# Patient Record
Sex: Female | Born: 1980 | Race: White | Hispanic: No | Marital: Married | Smoking: Former smoker
Health system: Southern US, Community
[De-identification: ages and names within clinical notes are randomized; demographics above are authoritative.]

## PROBLEM LIST (undated history)

## (undated) DIAGNOSIS — G039 Meningitis, unspecified: Secondary | ICD-10-CM

## (undated) DIAGNOSIS — I959 Hypotension, unspecified: Secondary | ICD-10-CM

## (undated) HISTORY — PX: KNEE SURGERY: SHX244

## (undated) HISTORY — PX: TONSILLECTOMY: SUR1361

## (undated) HISTORY — PX: APPENDECTOMY: SHX54

## (undated) HISTORY — PX: SHOULDER SURGERY: SHX246

## (undated) HISTORY — DX: Hypotension, unspecified: I95.9

---

## 2016-08-13 ENCOUNTER — Emergency Department

## 2016-08-13 ENCOUNTER — Encounter: Payer: Self-pay | Admitting: Emergency Medicine

## 2016-08-13 ENCOUNTER — Observation Stay
Admission: EM | Admit: 2016-08-13 | Discharge: 2016-08-14 | Disposition: A | Discharge status: Home / Self Care | Attending: Internal Medicine | Admitting: Internal Medicine

## 2016-08-13 DIAGNOSIS — F17210 Nicotine dependence, cigarettes, uncomplicated: Secondary | ICD-10-CM | POA: Insufficient documentation

## 2016-08-13 DIAGNOSIS — Z8661 Personal history of infections of the central nervous system: Secondary | ICD-10-CM | POA: Insufficient documentation

## 2016-08-13 DIAGNOSIS — Z885 Allergy status to narcotic agent status: Secondary | ICD-10-CM | POA: Insufficient documentation

## 2016-08-13 DIAGNOSIS — Z046 Encounter for general psychiatric examination, requested by authority: Secondary | ICD-10-CM | POA: Diagnosis present

## 2016-08-13 DIAGNOSIS — N3000 Acute cystitis without hematuria: Secondary | ICD-10-CM | POA: Insufficient documentation

## 2016-08-13 DIAGNOSIS — T39391A Poisoning by other nonsteroidal anti-inflammatory drugs [NSAID], accidental (unintentional), initial encounter: Principal | ICD-10-CM | POA: Insufficient documentation

## 2016-08-13 DIAGNOSIS — E876 Hypokalemia: Secondary | ICD-10-CM | POA: Diagnosis not present

## 2016-08-13 DIAGNOSIS — F10129 Alcohol abuse with intoxication, unspecified: Secondary | ICD-10-CM | POA: Insufficient documentation

## 2016-08-13 DIAGNOSIS — F419 Anxiety disorder, unspecified: Secondary | ICD-10-CM | POA: Insufficient documentation

## 2016-08-13 DIAGNOSIS — T510X1A Toxic effect of ethanol, accidental (unintentional), initial encounter: Secondary | ICD-10-CM | POA: Insufficient documentation

## 2016-08-13 DIAGNOSIS — R4182 Altered mental status, unspecified: Secondary | ICD-10-CM | POA: Insufficient documentation

## 2016-08-13 DIAGNOSIS — E872 Acidosis, unspecified: Secondary | ICD-10-CM

## 2016-08-13 DIAGNOSIS — Y929 Unspecified place or not applicable: Secondary | ICD-10-CM | POA: Insufficient documentation

## 2016-08-13 DIAGNOSIS — I959 Hypotension, unspecified: Secondary | ICD-10-CM

## 2016-08-13 DIAGNOSIS — T50904A Poisoning by unspecified drugs, medicaments and biological substances, undetermined, initial encounter: Secondary | ICD-10-CM

## 2016-08-13 DIAGNOSIS — F10920 Alcohol use, unspecified with intoxication, uncomplicated: Secondary | ICD-10-CM | POA: Diagnosis present

## 2016-08-13 DIAGNOSIS — G92 Toxic encephalopathy: Secondary | ICD-10-CM | POA: Diagnosis not present

## 2016-08-13 HISTORY — DX: Meningitis, unspecified: G03.9

## 2016-08-13 HISTORY — DX: Hypotension, unspecified: I95.9

## 2016-08-13 LAB — URINE DRUG SCREEN, QUALITATIVE (ARMC ONLY)
Amphetamines, Ur Screen: NOT DETECTED
BARBITURATES, UR SCREEN: NOT DETECTED
BENZODIAZEPINE, UR SCRN: NOT DETECTED
Cannabinoid 50 Ng, Ur ~~LOC~~: NOT DETECTED
Cocaine Metabolite,Ur ~~LOC~~: NOT DETECTED
MDMA (Ecstasy)Ur Screen: NOT DETECTED
Methadone Scn, Ur: NOT DETECTED
OPIATE, UR SCREEN: NOT DETECTED
PHENCYCLIDINE (PCP) UR S: NOT DETECTED
Tricyclic, Ur Screen: NOT DETECTED

## 2016-08-13 LAB — HCG, QUANTITATIVE, PREGNANCY: hCG, Beta Chain, Quant, S: <1 m[IU]/mL (ref <5)

## 2016-08-13 LAB — COMPREHENSIVE METABOLIC PANEL
ALT: 18 U/L (ref 14–54)
AST: 25 U/L (ref 15–41)
Albumin: 4.6 g/dL (ref 3.5–5.0)
Alkaline Phosphatase: 59 U/L (ref 38–126)
Anion gap: 17 — ABNORMAL HIGH (ref 5–15)
BUN: 16 mg/dL (ref 6–20)
CO2: 19 mmol/L — ABNORMAL LOW (ref 22–32)
Calcium: 9.2 mg/dL (ref 8.9–10.3)
Chloride: 104 mmol/L (ref 101–111)
Creatinine, Ser: 0.75 mg/dL (ref 0.44–1.00)
GFR calc non Af Amer: >60 mL/min (ref >60)
Glucose, Bld: 142 mg/dL — ABNORMAL HIGH (ref 65–99)
POTASSIUM: 3.2 mmol/L — AB (ref 3.5–5.1)
Sodium: 140 mmol/L (ref 135–145)
TOTAL PROTEIN: 7.7 g/dL (ref 6.5–8.1)
Total Bilirubin: 0.5 mg/dL (ref 0.3–1.2)

## 2016-08-13 LAB — CBC
HCT: 39.9 % (ref 35.0–47.0)
Hemoglobin: 14.1 g/dL (ref 12.0–16.0)
MCH: 34 pg (ref 26.0–34.0)
MCHC: 35.3 g/dL (ref 32.0–36.0)
MCV: 96.3 fL (ref 80.0–100.0)
PLATELETS: 267 10*3/uL (ref 150–440)
RBC: 4.14 MIL/uL (ref 3.80–5.20)
RDW: 12.8 % (ref 11.5–14.5)
WBC: 8.2 10*3/uL (ref 3.6–11.0)

## 2016-08-13 LAB — URINALYSIS, COMPLETE (UACMP) WITH MICROSCOPIC
Bilirubin Urine: NEGATIVE
Glucose, UA: 50 mg/dL — AB
Ketones, ur: 5 mg/dL — AB
Nitrite: NEGATIVE
PROTEIN: 100 mg/dL — AB
Specific Gravity, Urine: 1.012 (ref 1.005–1.030)
pH: 6 (ref 5.0–8.0)

## 2016-08-13 LAB — MAGNESIUM: Magnesium: 2.1 mg/dL (ref 1.7–2.4)

## 2016-08-13 LAB — ACETAMINOPHEN LEVEL

## 2016-08-13 LAB — LIPASE, BLOOD: LIPASE: 64 U/L — AB (ref 11–51)

## 2016-08-13 LAB — ETHANOL: ALCOHOL ETHYL (B): 212 mg/dL — AB (ref <5)

## 2016-08-13 LAB — INFLUENZA PANEL BY PCR (TYPE A & B)
Influenza A By PCR: NEGATIVE
Influenza B By PCR: NEGATIVE

## 2016-08-13 LAB — GLUCOSE, CAPILLARY: Glucose-Capillary: 139 mg/dL — ABNORMAL HIGH (ref 65–99)

## 2016-08-13 LAB — SALICYLATE LEVEL: Salicylate Lvl: <7 mg/dL (ref 2.8–30.0)

## 2016-08-13 LAB — TROPONIN I: Troponin I: <0.03 ng/mL (ref <0.03)

## 2016-08-13 LAB — LACTIC ACID, PLASMA
LACTIC ACID, VENOUS: 4.1 mmol/L — AB (ref 0.5–1.9)
Lactic Acid, Venous: 4.6 mmol/L (ref 0.5–1.9)

## 2016-08-13 MED ORDER — ADULT MULTIVITAMIN W/MINERALS CH: 1.0000 | ORAL_TABLET | Freq: Every day | ORAL | Status: DC

## 2016-08-13 MED ORDER — DEXTROSE 5 % IV SOLN
1.0000 g | INTRAVENOUS | Status: DC
  Administered 2016-08-13: 1 g via INTRAVENOUS
  Filled 2016-08-13 (×2): qty 10

## 2016-08-13 MED ORDER — ONDANSETRON HCL 4 MG PO TABS: 4.0000 mg | ORAL_TABLET | Freq: Four times a day (QID) | ORAL | Status: DC | PRN

## 2016-08-13 MED ORDER — LORAZEPAM 1 MG PO TABS
1.0000 mg | ORAL_TABLET | Freq: Four times a day (QID) | ORAL | Status: DC | PRN
  Administered 2016-08-14: 1 mg via ORAL
  Filled 2016-08-13: qty 1

## 2016-08-13 MED ORDER — ONDANSETRON HCL 4 MG/2ML IJ SOLN: 4.0000 mg | Freq: Four times a day (QID) | INTRAMUSCULAR | Status: DC | PRN

## 2016-08-13 MED ORDER — ENOXAPARIN SODIUM 40 MG/0.4ML ~~LOC~~ SOLN
40.0000 mg | SUBCUTANEOUS | Status: DC
  Administered 2016-08-13: 40 mg via SUBCUTANEOUS
  Filled 2016-08-13: qty 0.4

## 2016-08-13 MED ORDER — POTASSIUM CHLORIDE CRYS ER 20 MEQ PO TBCR
40.0000 meq | EXTENDED_RELEASE_TABLET | Freq: Once | ORAL | Status: AC
  Administered 2016-08-13: 40 meq via ORAL
  Filled 2016-08-13: qty 2

## 2016-08-13 MED ORDER — ACETAMINOPHEN 650 MG RE SUPP: 650.0000 mg | Freq: Four times a day (QID) | RECTAL | Status: DC | PRN

## 2016-08-13 MED ORDER — VITAMIN B-1 100 MG PO TABS: 100.0000 mg | ORAL_TABLET | Freq: Every day | ORAL | Status: DC

## 2016-08-13 MED ORDER — SODIUM CHLORIDE 0.9 % IV SOLN
INTRAVENOUS | Status: DC
  Administered 2016-08-13: 21:00:00 via INTRAVENOUS

## 2016-08-13 MED ORDER — LORAZEPAM 2 MG/ML IJ SOLN: 1.0000 mg | Freq: Four times a day (QID) | INTRAMUSCULAR | Status: DC | PRN

## 2016-08-13 MED ORDER — SODIUM CHLORIDE 0.9 % IV BOLUS (SEPSIS)
1000.0000 mL | Freq: Once | INTRAVENOUS | Status: AC
  Administered 2016-08-13: 1000 mL via INTRAVENOUS

## 2016-08-13 MED ORDER — CEFTRIAXONE SODIUM-DEXTROSE 1-3.74 GM-% IV SOLR: 1.0000 g | Freq: Every day | INTRAVENOUS | Status: DC

## 2016-08-13 MED ORDER — SODIUM CHLORIDE 0.9% FLUSH
3.0000 mL | Freq: Two times a day (BID) | INTRAVENOUS | Status: DC
  Administered 2016-08-13: 3 mL via INTRAVENOUS

## 2016-08-13 MED ORDER — NICOTINE 7 MG/24HR TD PT24
7.0000 mg | MEDICATED_PATCH | Freq: Every day | TRANSDERMAL | Status: DC
  Filled 2016-08-13: qty 1

## 2016-08-13 MED ORDER — TRAMADOL HCL 50 MG PO TABS: 50.0000 mg | ORAL_TABLET | Freq: Four times a day (QID) | ORAL | Status: DC | PRN

## 2016-08-13 MED ORDER — FOLIC ACID 1 MG PO TABS: 1.0000 mg | ORAL_TABLET | Freq: Every day | ORAL | Status: DC

## 2016-08-13 MED ORDER — THIAMINE HCL 100 MG/ML IJ SOLN: 100.0000 mg | Freq: Every day | INTRAMUSCULAR | Status: DC

## 2016-08-13 MED ORDER — ACETAMINOPHEN 325 MG PO TABS
650.0000 mg | ORAL_TABLET | Freq: Four times a day (QID) | ORAL | Status: DC | PRN
  Administered 2016-08-14: 650 mg via ORAL
  Filled 2016-08-13: qty 2

## 2016-08-13 NOTE — Consult Note (Signed)
Pharmacy Antibiotic Note  Cynthia Barnes is a 36 y.o. female admitted on 08/13/2016 with UTI.  Pharmacy has been consulted for ceftriaxone dosing.  Plan: ceftriaxone 1g q 24 hours  Height: 5' (152.4 cm) Weight: 160 lb (72.6 kg) IBW/kg (Calculated) : 45.5  Temp (24hrs), Avg:97.3 F (36.3 C), Min:97.3 F (36.3 C), Max:97.3 F (36.3 C)   Recent Labs Lab 08/13/16 1604  WBC 8.2  CREATININE 0.75  LATICACIDVEN 4.6*    Estimated Creatinine Clearance: 86.4 mL/min (by C-G formula based on SCr of 0.75 mg/dL).    Allergies  Allergen Reactions  . Codeine Other (See Comments)    Pt states her "eyes fixated upwards"    Antimicrobials this admission: Ceftriaxone 2/9  >>    Dose adjustments this admission:   Microbiology results: 2/9 BCx:  2/9 UCx:     Thank you for allowing pharmacy to be a part of this patient's care.  Olene FlossMelissa D Clay Solum, Pharm.D, BCPS Clinical Pharmacist  08/13/2016 7:32 PM

## 2016-08-13 NOTE — H&P (Signed)
[facility] [place] [facility]   PATIENT NAME: Cynthia Barnes    MR#:  [id number]  DATE OF BIRTH:  26-Aug-1980  DATE OF ADMISSION:  08/13/2016  PRIMARY CARE PHYSICIAN: Pcp Not In System   REQUESTING/REFERRING PHYSICIAN: Dr. Sharyn Creamer  CHIEF COMPLAINT:   Chief Complaint  Patient presents with  . Drug Overdose    HISTORY OF PRESENT ILLNESS:  [name]  is a 36 y.o. female with No significant past medical history presents to the hospital secondary to unresponsive episode. Patient is awake alert oriented 3 and is able to provide most of the history. She says that she consumes alcohol occasionally, however according to husband she drinks alcohol without anybody noticing it. Her serum alcohol level was greater than 200 today. Also she has had rotator cuff surgery on her right shoulder and degenerative disc disease in her neck, has back pains occasionally. She usually takes ibuprofen for that. Her husband had a prescription of meloxicam for 30 pills given in December 2017, unknown number of pills left over in the bottle. She was noted to have taken few of those pills. Patient is awake and states she took only 4 or 5 pills for her pain. Denies any suicidal ideation. Her blood pressure was noted to be low with systolic in the 60s on arrival. Improving slowly with IV fluids. She denies any chest pain, headaches or dizziness. No fevers, chills, nausea or vomiting noted. No arrhythmias on telemetry.  PAST MEDICAL HISTORY:   Past Medical History:  Diagnosis Date  . Meningitis     PAST SURGICAL HISTORY:   Past Surgical History:  Procedure Laterality Date  . APPENDECTOMY    . CESAREAN SECTION    . KNEE SURGERY Left   . SHOULDER SURGERY Right   . TONSILLECTOMY      SOCIAL HISTORY:   Social History  Substance Use Topics  . Smoking status: Current Every Day Smoker    Packs/day: 0.50    Types: Cigarettes  . Smokeless tobacco: Never Used  .  Alcohol use Yes     Comment: Pt states drinking "ocassionally"    FAMILY HISTORY:  No family history on file.  DRUG ALLERGIES:   Allergies  Allergen Reactions  . Codeine Other (See Comments)    Pt states her "eyes fixated upwards"    REVIEW OF SYSTEMS:   Review of Systems  Constitutional: Positive for malaise/fatigue. Negative for chills, fever and weight loss.  HENT: Negative for ear discharge, ear pain, hearing loss and nosebleeds.   Eyes: Negative for blurred vision, double vision and photophobia.  Respiratory: Negative for cough, hemoptysis, shortness of breath and wheezing.   Cardiovascular: Negative for chest pain, palpitations, orthopnea and leg swelling.  Gastrointestinal: Negative for abdominal pain, constipation, diarrhea, melena, nausea and vomiting.  Genitourinary: Negative for dysuria and urgency.  Musculoskeletal: Positive for back pain and myalgias. Negative for neck pain.  Skin: Negative for rash.  Neurological: Negative for dizziness, sensory change, speech change, focal weakness and headaches.  Endo/Heme/Allergies: Does not bruise/bleed easily.  Psychiatric/Behavioral: Negative for depression. The patient is nervous/anxious.     MEDICATIONS AT HOME:   Prior to Admission medications   Not on File      VITAL SIGNS:  Blood pressure 110/60, pulse 80, temperature 97.3 F (36.3 C), temperature source Oral, resp. rate (!) 24, height 5' (1.524 m), weight 72.6 kg (160 lb), SpO2 98 %.  PHYSICAL EXAMINATION:   Physical Exam  GENERAL:  36 y.o.-year-old  patient lying in the bed with no acute distress.  EYES: Pupils equal, round, reactive to light and accommodation. No scleral icterus. Extraocular muscles intact.  HEENT: Head atraumatic, normocephalic. Oropharynx and nasopharynx clear. Small contusion on forehead NECK:  Supple, no jugular venous distention. No thyroid enlargement, no tenderness.  LUNGS: Normal breath sounds bilaterally, no wheezing,  rales,rhonchi or crepitation. No use of accessory muscles of respiration.  CARDIOVASCULAR: S1, S2 normal. No murmurs, rubs, or gallops.  ABDOMEN: Soft, nontender, nondistended. Bowel sounds present. No organomegaly or mass.  EXTREMITIES: No pedal edema, cyanosis, or clubbing. Tender right rotator cuff NEUROLOGIC: Cranial nerves II through XII are intact. Muscle strength 5/5 in all extremities. Sensation intact. Gait not checked.  PSYCHIATRIC: The patient is alert and oriented x 3.  SKIN: No obvious rash, lesion, or ulcer.   LABORATORY PANEL:   CBC  Recent Labs Lab 08/13/16 1604  WBC 8.2  HGB 14.1  HCT 39.9  PLT 267   ------------------------------------------------------------------------------------------------------------------  Chemistries   Recent Labs Lab 08/13/16 1604  NA 140  K 3.2*  CL 104  CO2 19*  GLUCOSE 142*  BUN 16  CREATININE 0.75  CALCIUM 9.2  AST 25  ALT 18  ALKPHOS 59  BILITOT 0.5   ------------------------------------------------------------------------------------------------------------------  Cardiac Enzymes  Recent Labs Lab 08/13/16 1604  TROPONINI <0.03   ------------------------------------------------------------------------------------------------------------------  RADIOLOGY:  Ct Head Wo Contrast  Result Date: 08/13/2016 CLINICAL DATA:  Status post fall, right forehead swelling EXAM: CT HEAD WITHOUT CONTRAST TECHNIQUE: Contiguous axial images were obtained from the base of the skull through the vertex without intravenous contrast. COMPARISON:  None. FINDINGS: Brain: No evidence of acute infarction, hemorrhage, hydrocephalus, extra-axial collection or mass lesion/mass effect. Vascular: No hyperdense vessel or unexpected calcification. Skull: Normal. Negative for fracture or focal lesion. Sinuses/Orbits: No acute finding. Other: None. IMPRESSION: No acute intracranial pathology. Electronically Signed   By: Elige Ko   On: 08/13/2016  17:41   Dg Chest Port 1 View  Result Date: 08/13/2016 CLINICAL DATA:  Cough.  Hypotension. EXAM: PORTABLE CHEST 1 VIEW COMPARISON:  None. FINDINGS: Lungs are clear. Heart size and pulmonary vascularity are normal. No adenopathy. No bone lesions. IMPRESSION: No edema or consolidation. Electronically Signed   By: Bretta Bang III M.D.   On: 08/13/2016 16:20    EKG:   Orders placed or performed during the hospital encounter of 08/13/16  . ED EKG  . ED EKG  . EKG 12-Lead  . EKG 12-Lead    IMPRESSION AND PLAN:   Kevyn Wengert  is a 36 y.o. female with No significant past medical history presents to the hospital secondary to unresponsive episode.  #1 altered mental status-toxic metabolic encephalopathy. -Due to consumption of alcohol and also meloxicam at the same time. -Denies any suicidal ideation. No prior suicidal attempts, denies any depression. Does have anxiety issues. -: Currently under involuntary commitment,  awaiting psych consult.  #2 hypotension-likely hypovolemic. Continue IV fluids as blood pressure is responding to fluids. -Not symptomatic. Monitor on the floor.  #3 hypokalemia-being replaced. Check magnesium level  #4 tobacco use disorder-counseled. Started on nicotine patch  #5 alcohol abuse-started on CIWA protocol.  #6 DVT prophylaxis-on Lovenox  #7 lactic acidosis-likely triggered by hypotension. No source of infection identified. Chest x-rays clear. No fevers noted. Pending urine analysis.    All the records are reviewed and case discussed with ED provider. Management plans discussed with the patient, family and they are in agreement.  CODE STATUS: Full Code  TOTAL  TIME TAKING CARE OF THIS PATIENT: 50 minutes.    Virga Haltiwanger M.D on 08/13/2016 at 6:32 PM  Between 7am to 6pm - Pager - [phone]  After 6pm go to [email or web address] - password [email or web address]  [facility]  Office  [phone]  CC: Primary care physician;  Pcp Not In System

## 2016-08-13 NOTE — ED Triage Notes (Signed)
Pt brought in by EMS c/o ingestion of unknown quantity of Meloxicam 15mg  tabs belonging to her husband. Per EMS initial bp was 48/24; after 400 ml of 500 ml bolus bp increased to 91/54, heart rate was in the 100s, and 94% on room air.  Pt present to ED alert and oriented x4; able to speak in complete sentences is able to speak coherently.

## 2016-08-13 NOTE — ED Provider Notes (Signed)
[facility] Emergency Department Provider Note   ____________________________________________   First MD Initiated Contact with Patient 08/13/16 1557     (approximate)  I have reviewed the triage vital signs and the nursing notes.   HISTORY  Chief Complaint  HPI Cynthia Barnes is a 36 y.o. female here for evaluation of a possible overdose  EMS reports that the patient and husband came home, found her tired, drowsy, and noted that a bottle of his meloxicam was empty. The patient was initially hypotensive, cyanotic around the lips with somnolence when EMS arrived. They report after receiving fluids for blood pressure improved from 50/20, into the 80s  The patient has become more responsive, is now conversant  The patient herself reports that today she took 2 of her husbands meloxicam because she has chronic back pain, she also took a "shot of makers Praise Dolecki" which she drinks almost daily, and then was still having discomfort so she took an additional meloxicam  The patient denies suicidal ideation, reports she would never do anything to harm herself because of her children  She denies taking any other ingestants. She has had a cough and runny nose for the last week, reports mild symptoms and thinks she could've had "the flu" but overall is getting better. She has not been eating and drinking as much as she normally would.  EMS also reports the patient was walking and became weak and fell striking the right front of her forehead with a small hematoma in they're present.  Patient denies any weakness in an arm or leg. No headache. No nausea or vomiting.  Past Medical History:  Diagnosis Date  . Meningitis     Patient Active Problem List   Diagnosis Date Noted  . Hypotension 08/13/2016    Past Surgical History:  Procedure Laterality Date  . APPENDECTOMY    . CESAREAN SECTION    . KNEE SURGERY Left   . SHOULDER SURGERY Right   . TONSILLECTOMY        Prior to Admission medications   Medication Sig Start Date End Date Taking? Authorizing Provider  ibuprofen (ADVIL,MOTRIN) 200 MG tablet Take 200 mg by mouth every 6 (six) hours as needed.   Yes Historical Provider, MD  Multiple Vitamin (MULTIVITAMIN) tablet Take 1 tablet by mouth daily.   Yes Historical Provider, MD    Allergies Codeine  No family history on file.  Social History Social History  Substance Use Topics  . Smoking status: Current Every Day Smoker    Packs/day: 0.50    Types: Cigarettes  . Smokeless tobacco: Never Used  . Alcohol use Yes     Comment: Pt states drinking "ocassionally"   Denies smoking. Does report she drinks about one shot of alcohol daily. Denies any other drug use  Review of Systems Constitutional: Felt like she was having fevers a few days ago with "the flu" which is not getting better Eyes: No visual changes. ENT: No sore throat. Cardiovascular: Denies chest pain. Respiratory: Denies shortness of breath. A dry nonproductive cough for the last week. Gastrointestinal: No abdominal pain.  No nausea, no vomiting.  No diarrhea.  No constipation. Genitourinary: Negative for dysuria. Musculoskeletal: Negative for back pain. Skin: Negative for rash. Neurological: Negative for headaches, focal weakness or numbness.  10-point ROS otherwise negative.  ____________________________________________   PHYSICAL EXAM:  VITAL SIGNS: ED Triage Vitals  Enc Vitals Group     BP      Pulse  Resp      Temp      Temp src      SpO2      Weight      Height      Head Circumference      Peak Flow      Pain Score      Pain Loc      Pain Edu?      Excl. in GC?     Constitutional: Alert and oriented. Mildly ill-appearing, slightly diaphoretic and slightly cool skin. She is well oriented, with no evidence of lethargy or somnolence at this time. She is tearful. Eyes: Conjunctivae are normal. PERRL. EOMI. Head: Atraumatic. Nose: No  congestion/rhinnorhea. Mouth/Throat: Mucous membranes are notably dry.  Oropharynx non-erythematous. Neck: No stridor.   Cardiovascular: Normal rate, regular rhythm. Grossly normal heart sounds.  Good peripheral circulation. Respiratory: Normal respiratory effort.  No retractions. Lungs CTAB. Gastrointestinal: Soft and nontender. No distention. No abdominal bruits. No CVA tenderness. A small, what appears to be old ecchymosis is noted about the size of a half-dollar over the left lower abdomen. Patient denies any tenderness or pain in the area. Patient reports she's not sure where that came from. Musculoskeletal: No lower extremity tenderness nor edema.  No joint effusions. Neurologic:  Normal speech and language. No gross focal neurologic deficits are appreciated. Moves all extremities equally. No facial droop. Normal cranial nerve exam. 5 out of 5 strength in all extremities. Skin:  Skin is warm, dry and intact. No rash noted. Psychiatric: Mood and affect are tearful, slightly flat. Speech and behavior are normal. The patient denies that this was any sort of suicide attempt. She reports she took meloxicam for treatment of her chronic back pain, as well as use alcohol today, but denies that she would ever harm herself. ____________________________________________   LABS (all labs ordered are listed, but only abnormal results are displayed)  Labs Reviewed  LACTIC ACID, PLASMA - Abnormal; Notable for the following:       Result Value   Lactic Acid, Venous 4.6 (*)    All other components within normal limits  COMPREHENSIVE METABOLIC PANEL - Abnormal; Notable for the following:    Potassium 3.2 (*)    CO2 19 (*)    Glucose, Bld 142 (*)    Anion gap 17 (*)    All other components within normal limits  LIPASE, BLOOD - Abnormal; Notable for the following:    Lipase 64 (*)    All other components within normal limits  URINALYSIS, COMPLETE (UACMP) WITH MICROSCOPIC - Abnormal; Notable for the  following:    Color, Urine YELLOW (*)    APPearance HAZY (*)    Glucose, UA 50 (*)    Hgb urine dipstick SMALL (*)    Ketones, ur 5 (*)    Protein, ur 100 (*)    Leukocytes, UA TRACE (*)    Bacteria, UA FEW (*)    Squamous Epithelial / LPF 0-5 (*)    All other components within normal limits  ETHANOL - Abnormal; Notable for the following:    Alcohol, Ethyl (B) 212 (*)    All other components within normal limits  ACETAMINOPHEN LEVEL - Abnormal; Notable for the following:    Acetaminophen (Tylenol), Serum <10 (*)    All other components within normal limits  GLUCOSE, CAPILLARY - Abnormal; Notable for the following:    Glucose-Capillary 139 (*)    All other components within normal limits  CULTURE, BLOOD (ROUTINE X 2)  CULTURE, BLOOD (ROUTINE X 2)  URINE CULTURE  CBC  TROPONIN I  HCG, QUANTITATIVE, PREGNANCY  INFLUENZA PANEL BY PCR (TYPE A & B)  SALICYLATE LEVEL  URINE DRUG SCREEN, QUALITATIVE ([facility] ONLY)  MAGNESIUM  LACTIC ACID, PLASMA  LACTIC ACID, PLASMA   ____________________________________________  EKG  ED ECG REPORT I, Toniya Rozar, the attending physician, personally viewed and interpreted this ECG.  Date: 08/13/2016 EKG Time: 1557 Rate: 80 Rhythm: normal sinus rhythm QRS Axis: normal Intervals: normal ST/T Wave abnormalities: normal Conduction Disturbances: A slight Q-wave is noted in V2, Narrative Interpretation: unremarkable except for a Q-wave in V2, no evidence of acute ischemia with normal T waves noted  ____________________________________________  RADIOLOGY  Ct Head Wo Contrast  Result Date: 08/13/2016 CLINICAL DATA:  Status post fall, right forehead swelling EXAM: CT HEAD WITHOUT CONTRAST TECHNIQUE: Contiguous axial images were obtained from the base of the skull through the vertex without intravenous contrast. COMPARISON:  None. FINDINGS: Brain: No evidence of acute infarction, hemorrhage, hydrocephalus, extra-axial collection or mass lesion/mass  effect. Vascular: No hyperdense vessel or unexpected calcification. Skull: Normal. Negative for fracture or focal lesion. Sinuses/Orbits: No acute finding. Other: None. IMPRESSION: No acute intracranial pathology. Electronically Signed   By: Elige KoHetal  Patel   On: 08/13/2016 17:41   Dg Chest Port 1 View  Result Date: 08/13/2016 CLINICAL DATA:  Cough.  Hypotension. EXAM: PORTABLE CHEST 1 VIEW COMPARISON:  None. FINDINGS: Lungs are clear. Heart size and pulmonary vascularity are normal. No adenopathy. No bone lesions. IMPRESSION: No edema or consolidation. Electronically Signed   By: Bretta BangWilliam  Woodruff III M.D.   On: 08/13/2016 16:20    ____________________________________________   PROCEDURES  Procedure(s) performed: None  Procedures  Critical Care performed: Yes, see critical care note(s)  ____________________________________________   INITIAL IMPRESSION / ASSESSMENT AND PLAN / ED COURSE  Pertinent labs & imaging results that were available during my care of the patient were reviewed by me and considered in my medical decision making (see chart for details).  Hypotension. Broad differential and workup. Suspect likely dehydration given fluid responsiveness. No cardiac abnormalities, no pulmonary abnormalities. Recent cold-like symptoms, but also associated with what appears to be alcohol intoxication with unclear intent of overuse of meloxicam, versus possible suicidal intent though favor a prior primarily.  Clinical Course as of Aug 14 1927  Fri Aug 13, 2016  1754 Critical lactate. 2 L of normal saline artery completed, third liter ordered. Repeat lactate ordered. At this point I am unclear if the elevated lactate is truly due to an infectious etiology, potentially related to this acute hypotensive episode area  Afebrile, no elevated white count. No infectious symptoms other than recent cough cold congestion which she reports are improving.  CRITICAL CARE Performed by: Sharyn CreamerQUALE,  Macsen Nuttall   Total critical care time: 40 minutes  Critical care time was exclusive of separately billable procedures and treating other patients.  Critical care was necessary to treat or prevent imminent or life-threatening deterioration.  Critical care was time spent personally by me on the following activities: development of treatment plan with patient and/or surrogate as well as nursing, discussions with consultants, evaluation of patient's response to treatment, examination of patient, obtaining history from patient or surrogate, ordering and performing treatments and interventions, ordering and review of laboratory studies, ordering and review of radiographic studies, pulse oximetry and re-evaluation of patient's condition.   [MQ]  1928 Hemodynamics improving. Repeat lactate pending soon. Patient remains awake and alert without distress  [MQ]  Clinical Course User Index [MQ] Sharyn Creamer, MD     ____________________________________________   FINAL CLINICAL IMPRESSION(S) / ED DIAGNOSES  Final diagnoses:  Lactic acid acidosis  Hypotension, unspecified hypotension type  Alcoholic intoxication without complication (HCC)  Drug overdose, undetermined intent, initial encounter  Involuntary commitment      NEW MEDICATIONS STARTED DURING THIS VISIT:  New Prescriptions   No medications on file     Note:  This document was prepared using Dragon voice recognition software and may include unintentional dictation errors.     Sharyn Creamer, MD 08/13/16 [id number]

## 2016-08-14 ENCOUNTER — Encounter (HOSPITAL_COMMUNITY): Payer: Self-pay | Admitting: Psychiatry

## 2016-08-14 LAB — BASIC METABOLIC PANEL
Anion gap: 7 (ref 5–15)
BUN: 15 mg/dL (ref 6–20)
CALCIUM: 8 mg/dL — AB (ref 8.9–10.3)
CO2: 19 mmol/L — ABNORMAL LOW (ref 22–32)
CREATININE: 0.73 mg/dL (ref 0.44–1.00)
Chloride: 112 mmol/L — ABNORMAL HIGH (ref 101–111)
GFR calc Af Amer: >60 mL/min (ref >60)
Glucose, Bld: 85 mg/dL (ref 65–99)
POTASSIUM: 3.9 mmol/L (ref 3.5–5.1)
SODIUM: 138 mmol/L (ref 135–145)

## 2016-08-14 LAB — LACTIC ACID, PLASMA
LACTIC ACID, VENOUS: 2.5 mmol/L — AB (ref 0.5–1.9)
Lactic Acid, Venous: 2.5 mmol/L (ref 0.5–1.9)
Lactic Acid, Venous: 2.4 mmol/L (ref 0.5–1.9)

## 2016-08-14 LAB — BLOOD CULTURE ID PANEL (REFLEXED)
Acinetobacter baumannii: NOT DETECTED
CANDIDA TROPICALIS: NOT DETECTED
Candida albicans: NOT DETECTED
Candida glabrata: NOT DETECTED
Candida krusei: NOT DETECTED
Candida parapsilosis: NOT DETECTED
ENTEROCOCCUS SPECIES: NOT DETECTED
Enterobacter cloacae complex: NOT DETECTED
Enterobacteriaceae species: NOT DETECTED
Escherichia coli: NOT DETECTED
Haemophilus influenzae: NOT DETECTED
KLEBSIELLA PNEUMONIAE: NOT DETECTED
Klebsiella oxytoca: NOT DETECTED
LISTERIA MONOCYTOGENES: NOT DETECTED
Methicillin resistance: DETECTED — AB
NEISSERIA MENINGITIDIS: NOT DETECTED
Proteus species: NOT DETECTED
Pseudomonas aeruginosa: NOT DETECTED
SERRATIA MARCESCENS: NOT DETECTED
STAPHYLOCOCCUS AUREUS BCID: NOT DETECTED
STAPHYLOCOCCUS SPECIES: DETECTED — AB
STREPTOCOCCUS AGALACTIAE: NOT DETECTED
STREPTOCOCCUS SPECIES: NOT DETECTED
Streptococcus pneumoniae: NOT DETECTED
Streptococcus pyogenes: NOT DETECTED

## 2016-08-14 LAB — CBC
HCT: 32.1 % — ABNORMAL LOW (ref 35.0–47.0)
Hemoglobin: 11.2 g/dL — ABNORMAL LOW (ref 12.0–16.0)
MCH: 33.7 pg (ref 26.0–34.0)
MCHC: 34.8 g/dL (ref 32.0–36.0)
MCV: 97 fL (ref 80.0–100.0)
PLATELETS: 219 10*3/uL (ref 150–440)
RBC: 3.31 MIL/uL — ABNORMAL LOW (ref 3.80–5.20)
RDW: 12.5 % (ref 11.5–14.5)
WBC: 10.7 10*3/uL (ref 3.6–11.0)

## 2016-08-14 LAB — HEMOGLOBIN: HEMOGLOBIN: 11.7 g/dL — AB (ref 12.0–16.0)

## 2016-08-14 MED ORDER — CEPHALEXIN 250 MG PO CAPS: 250.0000 mg | ORAL_CAPSULE | Freq: Three times a day (TID) | ORAL | 0 refills | Status: DC

## 2016-08-14 NOTE — Progress Notes (Addendum)
Dr. Daleen Boavi gave verbal order to D/C order for IVC. Spoke with Dr. Nemiah CommanderKalisetti. Pt ok to discharge. Will f/u as outpatient.

## 2016-08-14 NOTE — Progress Notes (Signed)
Patient discharging home. Instructions given to patient, verbalized understanding. Husband will be providing transportation home. Waiting on husband. IV removed. Continue to monitor.

## 2016-08-14 NOTE — Discharge Summary (Signed)
[facility] [place] [facility]   PATIENT NAME: Cynthia Barnes    MR#:  [id number]  DATE OF BIRTH:  [birth year]  DATE OF ADMISSION:  08/13/2016   ADMITTING PHYSICIAN: Enid Baas, MD  DATE OF DISCHARGE: 08/14/16  PRIMARY CARE PHYSICIAN: Pcp Not In System   ADMISSION DIAGNOSIS:   Lactic acid acidosis [E87.2] Involuntary commitment [Z04.6] Alcoholic intoxication without complication (HCC) [F10.920] Drug overdose, undetermined intent, initial encounter [T50.904A] Hypotension, unspecified hypotension type [I95.9]  DISCHARGE DIAGNOSIS:   Active Problems:   Hypotension   SECONDARY DIAGNOSIS:   Past Medical History:  Diagnosis Date  . Meningitis     HOSPITAL COURSE:   Cynthia Barnes  is a 36 y.o. female with No significant past medical history presents to the hospital secondary to unresponsive episode.  #1 altered mental status-toxic metabolic encephalopathy. -Due to consumption of alcohol and also meloxicam at the same time. -Denies any suicidal ideation. No prior suicidal attempts, denies any depression. Does have anxiety issues. -awaiting psych consult. - will need outpatient psychiatrist follow up for anxiety issues ad may be post partum depression - discussed about concomitant use of alcohol with pain meds should be avoided  #2 hypotension-likely hypovolemic. Improved with IV fluids  #3 hypokalemia-replaced.   #4 tobacco use disorder-counseled. on nicotine patch in the hospital  #5 alcohol abuse-on CIWA protocol. ativan PRN received more for anxiety  #6 lactic acidosis-likely triggered by hypotension. Chest x-rays clear. No fevers noted. Hb stable Treating UTI. Stable lactic acid, though slightly elevated  #7 Acute cystitis- urine cultures sent, received rocephin in the hospital. Discharge on keflex   DISCHARGE CONDITIONS:   Guarded  CONSULTS OBTAINED:   Treatment Team:  Patrick North, MD  DRUG  ALLERGIES:   Allergies  Allergen Reactions  . Codeine Other (See Comments)    Pt states her "eyes fixated upwards"   DISCHARGE MEDICATIONS:   Allergies as of 08/14/2016      Reactions   Codeine Other (See Comments)   Pt states her "eyes fixated upwards"      Medication List    TAKE these medications   cephALEXin 250 MG capsule Commonly known as:  KEFLEX Take 1 capsule (250 mg total) by mouth 3 (three) times daily. X 5 more days   ibuprofen 200 MG tablet Commonly known as:  ADVIL,MOTRIN Take 200 mg by mouth every 6 (six) hours as needed.   multivitamin tablet Take 1 tablet by mouth daily.        DISCHARGE INSTRUCTIONS:   1. Psych f/u in 1 week  DIET:   Regular diet  ACTIVITY:   Activity as tolerated  OXYGEN:   Home Oxygen: No.  Oxygen Delivery: room air  DISCHARGE LOCATION:   home   If you experience worsening of your admission symptoms, develop shortness of breath, life threatening emergency, suicidal or homicidal thoughts you must seek medical attention immediately by calling 911 or calling your MD immediately  if symptoms less severe.  You Must read complete instructions/literature along with all the possible adverse reactions/side effects for all the Medicines you take and that have been prescribed to you. Take any new Medicines after you have completely understood and accpet all the possible adverse reactions/side effects.   Please note  You were cared for by a hospitalist during your hospital stay. If you have any questions about your discharge medications or the care you received while you were in the hospital after you are discharged, you can call the unit  and asked to speak with the hospitalist on call if the hospitalist that took care of you is not available. Once you are discharged, your primary care physician will handle any further medical issues. Please note that NO REFILLS for any discharge medications will be authorized once you are  discharged, as it is imperative that you return to your primary care physician (or establish a relationship with a primary care physician if you do not have one) for your aftercare needs so that they can reassess your need for medications and monitor your lab values.    On the day of Discharge:  VITAL SIGNS:   Blood pressure (!) 162/89, pulse 99, temperature 97.9 F (36.6 C), resp. rate 18, height 5' (1.524 m), weight 72.6 kg (160 lb), SpO2 99 %.  PHYSICAL EXAMINATION:    GENERAL:  36 y.o.-year-old patient lying in the bed with no acute distress.  EYES: Pupils equal, round, reactive to light and accommodation. No scleral icterus. Extraocular muscles intact.  HEENT: Head atraumatic, normocephalic. Oropharynx and nasopharynx clear. Small contusion on forehead NECK:  Supple, no jugular venous distention. No thyroid enlargement, no tenderness.  LUNGS: Normal breath sounds bilaterally, no wheezing, rales,rhonchi or crepitation. No use of accessory muscles of respiration.  CARDIOVASCULAR: S1, S2 normal. No murmurs, rubs, or gallops.  ABDOMEN: Soft, nontender, nondistended. Bowel sounds present. No organomegaly or mass.  EXTREMITIES: No pedal edema, cyanosis, or clubbing. Tender right rotator cuff NEUROLOGIC: Cranial nerves II through XII are intact. Muscle strength 5/5 in all extremities. Sensation intact. Gait not checked.  PSYCHIATRIC: The patient is alert and oriented x 3.  SKIN: No obvious rash, lesion, or ulcer.   DATA REVIEW:   CBC  Recent Labs Lab 08/14/16 0304 08/14/16 0913  WBC 10.7  --   HGB 11.2* 11.7*  HCT 32.1*  --   PLT 219  --     Chemistries   Recent Labs Lab 08/13/16 1604 08/14/16 0304  NA 140 138  K 3.2* 3.9  CL 104 112*  CO2 19* 19*  GLUCOSE 142* 85  BUN 16 15  CREATININE 0.75 0.73  CALCIUM 9.2 8.0*  MG 2.1  --   AST 25  --   ALT 18  --   ALKPHOS 59  --   BILITOT 0.5  --      Microbiology Results  Results for orders placed or performed during  the hospital encounter of 08/13/16  Culture, blood (Routine X 2) w Reflex to ID Panel     Status: None (Preliminary result)   Collection Time: 08/13/16  4:04 PM  Result Value Ref Range Status   Specimen Description BLOOD LEFT WRIST  Final   Special Requests BOTTLES DRAWN AEROBIC AND ANAEROBIC AER9ML ANA7ML  Final   Culture NO GROWTH < 24 HOURS  Final   Report Status PENDING  Incomplete  Culture, blood (Routine X 2) w Reflex to ID Panel     Status: None (Preliminary result)   Collection Time: 08/13/16  4:04 PM  Result Value Ref Range Status   Specimen Description BLOOD RIGHT ASSIST CONTROL  Final   Special Requests BOTTLES DRAWN AEROBIC AND ANAEROBIC ANA6ML AER5ML  Final   Culture NO GROWTH < 24 HOURS  Final   Report Status PENDING  Incomplete    RADIOLOGY:  Ct Head Wo Contrast  Result Date: 08/13/2016 CLINICAL DATA:  Status post fall, right forehead swelling EXAM: CT HEAD WITHOUT CONTRAST TECHNIQUE: Contiguous axial images were obtained from the base of the skull  through the vertex without intravenous contrast. COMPARISON:  None. FINDINGS: Brain: No evidence of acute infarction, hemorrhage, hydrocephalus, extra-axial collection or mass lesion/mass effect. Vascular: No hyperdense vessel or unexpected calcification. Skull: Normal. Negative for fracture or focal lesion. Sinuses/Orbits: No acute finding. Other: None. IMPRESSION: No acute intracranial pathology. Electronically Signed   By: Elige Ko   On: 08/13/2016 17:41   Dg Chest Port 1 View  Result Date: 08/13/2016 CLINICAL DATA:  Cough.  Hypotension. EXAM: PORTABLE CHEST 1 VIEW COMPARISON:  None. FINDINGS: Lungs are clear. Heart size and pulmonary vascularity are normal. No adenopathy. No bone lesions. IMPRESSION: No edema or consolidation. Electronically Signed   By: Bretta Bang III M.D.   On: 08/13/2016 16:20     Management plans discussed with the patient, family and they are in agreement.  CODE STATUS:     Code Status  Orders        Start     Ordered   08/13/16 2015  Full code  Continuous     08/13/16 2014    Code Status History    Date Active Date Inactive Code Status Order ID Comments User Context   This patient has a current code status but no historical code status.      TOTAL TIME TAKING CARE OF THIS PATIENT: 37 minutes.    Cynthia Barnes M.D on 08/14/2016 at 12:09 PM  Between 7am to 6pm - Pager - [phone]  After 6pm go to [email or web address] - Social research officer, government  [facility]  Office  [phone]  CC: Primary care physician; Pcp Not In System   Note: This dictation was prepared with Dragon dictation along with smaller phrase technology. Any transcriptional errors that result from this process are unintentional.

## 2016-08-14 NOTE — BH Assessment (Signed)
Writer informed psychiatrist (Dr. Ravi) of consult.  

## 2016-08-15 LAB — URINE CULTURE

## 2016-08-15 NOTE — Progress Notes (Signed)
PHARMACY - PHYSICIAN COMMUNICATION CRITICAL VALUE ALERT - BLOOD CULTURE IDENTIFICATION (BCID)  Results for orders placed or performed during the hospital encounter of 08/13/16  Blood Culture ID Panel (Reflexed) (Collected: 08/13/2016  4:04 PM)  Result Value Ref Range   Enterococcus species NOT DETECTED NOT DETECTED   Listeria monocytogenes NOT DETECTED NOT DETECTED   Staphylococcus species DETECTED (A) NOT DETECTED   Staphylococcus aureus NOT DETECTED NOT DETECTED   Methicillin resistance DETECTED (A) NOT DETECTED   Streptococcus species NOT DETECTED NOT DETECTED   Streptococcus agalactiae NOT DETECTED NOT DETECTED   Streptococcus pneumoniae NOT DETECTED NOT DETECTED   Streptococcus pyogenes NOT DETECTED NOT DETECTED   Acinetobacter baumannii NOT DETECTED NOT DETECTED   Enterobacteriaceae species NOT DETECTED NOT DETECTED   Enterobacter cloacae complex NOT DETECTED NOT DETECTED   Escherichia coli NOT DETECTED NOT DETECTED   Klebsiella oxytoca NOT DETECTED NOT DETECTED   Klebsiella pneumoniae NOT DETECTED NOT DETECTED   Proteus species NOT DETECTED NOT DETECTED   Serratia marcescens NOT DETECTED NOT DETECTED   Haemophilus influenzae NOT DETECTED NOT DETECTED   Neisseria meningitidis NOT DETECTED NOT DETECTED   Pseudomonas aeruginosa NOT DETECTED NOT DETECTED   Candida albicans NOT DETECTED NOT DETECTED   Candida glabrata NOT DETECTED NOT DETECTED   Candida krusei NOT DETECTED NOT DETECTED   Candida parapsilosis NOT DETECTED NOT DETECTED   Candida tropicalis NOT DETECTED NOT DETECTED    Name of physician (or Provider) Contacted: Willis/ Bonne DoloresKalisetti Jason spoke with MD Anne HahnWillis who [facility].  I spoke with MD Nemiah CommanderKalisetti this morning and she is ok with not treating for now. MD would like Pharmacy to follow micro ID and C and S. Call MD with final results please.    Changes to prescribed antibiotics  required: See above.   Magally Vahle D 08/15/2016  8:12 AM

## 2016-08-17 LAB — CULTURE, BLOOD (ROUTINE X 2)

## 2016-08-17 NOTE — Progress Notes (Signed)
Patient ID: Cynthia Barnes, female   DOB: 02/22/1981, 36 y.o.   MRN: [id number]  Patient is a 36 year old Caucasian female who was admitted with a probable overdose on her meloxicam and alcohol intoxication.  Patient was involuntarily committed by the ER physician and a consult was called  for psychiatry to assess patient on the unit. Patient reported that she has some severe neck and arm pain issues and that she had been drinking 4-5 cocktails and did not realize that she had taken more than she shared off her pain medication. Reports that she is a 36-year-old child and an 3914-month-old and had been feeling overwhelmed for a long time. She denied that this was a suicide attempt and stated that she would never do this. Patient has never seen a psychiatrist previously and has never had any suicide attempts. She has never been hospitalized for psychiatric reasons. Patient did report that she had mentioned being overwhelmed several times to her primary care physicians but it was not given the due to tension. She does endorse being highly anxious and being overwhelmed with her chores. Reports that her husband has a very busy job and since he recently moved here she has been feeling lonely and does not have much support. She denied any psychotic symptoms. She denied abuse of any other drugs. She did agree that her alcohol consumption was more than she should be drinking. Denies any suicidal thoughts. She appeared to have fair insight into her condition.  Impression and follow-up  1. Alcohol abuse Patient to follow up with outpatient psychiatry and counseling to address this issue.  2. Anxiety Name as above  Assessment and plan discussed with the patient's provider Dr. Nemiah CommanderKalisetti , who agreed with this provider and the patient will be given resources for follow-up with psychiatry. Patient at this time does not meet criteria for inpatient hospitalization.

## 2016-08-18 LAB — CULTURE, BLOOD (ROUTINE X 2): Culture: NO GROWTH

## 2016-09-02 DIAGNOSIS — R030 Elevated blood-pressure reading, without diagnosis of hypertension: Secondary | ICD-10-CM | POA: Insufficient documentation

## 2016-09-02 DIAGNOSIS — F419 Anxiety disorder, unspecified: Secondary | ICD-10-CM | POA: Insufficient documentation

## 2017-03-29 ENCOUNTER — Encounter: Payer: Self-pay | Admitting: Emergency Medicine

## 2017-03-29 ENCOUNTER — Emergency Department
Admission: EM | Admit: 2017-03-29 | Discharge: 2017-03-29 | Disposition: A | Discharge status: Home / Self Care | Attending: Emergency Medicine | Admitting: Emergency Medicine

## 2017-03-29 DIAGNOSIS — Z79899 Other long term (current) drug therapy: Secondary | ICD-10-CM | POA: Diagnosis not present

## 2017-03-29 DIAGNOSIS — Y999 Unspecified external cause status: Secondary | ICD-10-CM | POA: Insufficient documentation

## 2017-03-29 DIAGNOSIS — X500XXA Overexertion from strenuous movement or load, initial encounter: Secondary | ICD-10-CM | POA: Diagnosis not present

## 2017-03-29 DIAGNOSIS — F17210 Nicotine dependence, cigarettes, uncomplicated: Secondary | ICD-10-CM | POA: Diagnosis not present

## 2017-03-29 DIAGNOSIS — Y939 Activity, unspecified: Secondary | ICD-10-CM | POA: Insufficient documentation

## 2017-03-29 DIAGNOSIS — S39011A Strain of muscle, fascia and tendon of abdomen, initial encounter: Secondary | ICD-10-CM | POA: Diagnosis not present

## 2017-03-29 DIAGNOSIS — S3991XA Unspecified injury of abdomen, initial encounter: Secondary | ICD-10-CM | POA: Diagnosis present

## 2017-03-29 DIAGNOSIS — Y929 Unspecified place or not applicable: Secondary | ICD-10-CM | POA: Insufficient documentation

## 2017-03-29 MED ORDER — METHOCARBAMOL 500 MG PO TABS: 500.0000 mg | ORAL_TABLET | Freq: Four times a day (QID) | ORAL | 0 refills | Status: DC

## 2017-03-29 MED ORDER — MELOXICAM 15 MG PO TABS: 15.0000 mg | ORAL_TABLET | Freq: Every day | ORAL | 0 refills | Status: DC

## 2017-03-29 NOTE — ED Triage Notes (Signed)
Pt c/o left sided pain that started 1 week ago after trying to move large entertainment center by herself.  Pain is not constant and seems to be triggered with certain movements. No visible hernia or palpated hernia in triage.  Pain worse when bends downward towards ground.

## 2017-03-29 NOTE — ED Notes (Signed)
Pt ambulating around room. Alert and oriented.  States 4 months ago was moving and felt like she pulled a muscle in abdomen. States today she was moving an entertainment center and states LLQ pain. States "it feels like something is bulging." has taken ibuprofen at home.

## 2017-03-29 NOTE — ED Provider Notes (Signed)
[facility] Emergency Department Provider Note  ____________________________________________  Time seen: Approximately 8:25 PM  I have reviewed the triage vital signs and the nursing notes.   HISTORY  Chief Complaint pulled muscle    HPI Cynthia Barnes is a 36 y.o. female who presents to emergency room complaining of left abdominal wall pain. Patient reports that she was moving a couple months ago felt like she strained her abdominal wall has had intermittent spasms since. Patient reports that today her child went up underneath the entertainment center, became stuck, and she tried to move the entertainment center by herself. Patient reported a pulling sensation in the left abdominal wall. Since then she has had a cramping/spasming sensation to the left abdominal wall. No frank abdominal pain. No constipation or diarrhea. No other injury or complaint. No medications prior to arrival. No history of hernia.   Past Medical History:  Diagnosis Date  . Meningitis     Patient Active Problem List   Diagnosis Date Noted  . Hypotension 08/13/2016    Past Surgical History:  Procedure Laterality Date  . APPENDECTOMY    . CESAREAN SECTION    . KNEE SURGERY Left   . SHOULDER SURGERY Right   . TONSILLECTOMY      Prior to Admission medications   Medication Sig Start Date End Date Taking? Authorizing Provider  cephALEXin (KEFLEX) 250 MG capsule Take 1 capsule (250 mg total) by mouth 3 (three) times daily. X 5 more days 08/14/16   Enid Baas, MD  ibuprofen (ADVIL,MOTRIN) 200 MG tablet Take 200 mg by mouth every 6 (six) hours as needed.    [provider]  meloxicam (MOBIC) 15 MG tablet Take 1 tablet (15 mg total) by mouth daily. 03/29/17   Riham Polyakov, Delorise Royals, PA-C  methocarbamol (ROBAXIN) 500 MG tablet Take 1 tablet (500 mg total) by mouth 4 (four) times daily. 03/29/17   Larrisa Cravey, Delorise Royals, PA-C  Multiple Vitamin (MULTIVITAMIN) tablet Take  1 tablet by mouth daily.    [provider]    Allergies Codeine  History reviewed. No pertinent family history.  Social History Social History  Substance Use Topics  . Smoking status: Current Every Day Smoker    Packs/day: 0.50    Types: Cigarettes  . Smokeless tobacco: Never Used  . Alcohol use Yes     Comment: Pt states drinking "ocassionally"     Review of Systems  Constitutional: No fever/chills Cardiovascular: no chest pain. Respiratory: no cough. No SOB. Gastrointestinal: No abdominal pain.  No nausea, no vomiting.  No diarrhea.  No constipation. Genitourinary: Negative for dysuria. No hematuria Musculoskeletal: Positive for abdominal wall muscle tenderness and spasms. Skin: Negative for rash, abrasions, lacerations, ecchymosis. Neurological: Negative for headaches, focal weakness or numbness. 10-point ROS otherwise negative.  ____________________________________________   PHYSICAL EXAM:  VITAL SIGNS: ED Triage Vitals  Enc Vitals Group     BP 03/29/17 1851 (!) 151/87     Pulse Rate 03/29/17 1851 (!) 103     Resp 03/29/17 1851 20     Temp 03/29/17 1851 98.2 F (36.8 C)     Temp Source 03/29/17 1851 Oral     SpO2 03/29/17 1851 98 %     Weight 03/29/17 1852 160 lb (72.6 kg)     Height 03/29/17 1852  (1.499 m)     Head Circumference --      Peak Flow --      Pain Score 03/29/17 1851 5  Pain Loc --      Pain Edu? --      Excl. in GC? --      Constitutional: Alert and oriented. Well appearing and in no acute distress. Eyes: Conjunctivae are normal. PERRL. EOMI. Head: Atraumatic. Neck: No stridor.    Cardiovascular: Normal rate, regular rhythm. Normal S1 and S2.  Good peripheral circulation. Respiratory: Normal respiratory effort without tachypnea or retractions. Lungs CTAB. Good air entry to the bases with no decreased or absent breath sounds. Gastrointestinal: Bowel sounds 4 quadrants. Soft and nontender to palpation. No guarding or  rigidity. No palpable masses. No distention. No CVA tenderness. Musculoskeletal: Full range of motion to all extremities. No gross deformities appreciated.Patient with abdominal wall tenderness along middle Rectus abdominis muscle. No palpable bulge consistent with hernia. Neurologic:  Normal speech and language. No gross focal neurologic deficits are appreciated.  Skin:  Skin is warm, dry and intact. No rash noted. Psychiatric: Mood and affect are normal. Speech and behavior are normal. Patient exhibits appropriate insight and judgement.   ____________________________________________   LABS (all labs ordered are listed, but only abnormal results are displayed)  Labs Reviewed - No data to display ____________________________________________  EKG   ____________________________________________  RADIOLOGY   No results found.  ____________________________________________    PROCEDURES  Procedure(s) performed:    Procedures    Medications - No data to display   ____________________________________________   INITIAL IMPRESSION / ASSESSMENT AND PLAN / ED COURSE  Pertinent labs & imaging results that were available during my care of the patient were reviewed by me and considered in my medical decision making (see chart for details).  Review of the [place] CSRS was performed in accordance of the [facility] prior to dispensing any controlled drugs.     Patient's diagnosis is consistent with rectus abdominis muscle wall strain. No indication of hernia. No indication for labs or imaging at this time.. Patient will be discharged home with prescriptions for anti-inflammatory muscle relaxer. Patient is to follow up with primary care as needed or otherwise directed. Patient is given ED precautions to return to the ED for any worsening or new symptoms.     ____________________________________________  FINAL CLINICAL IMPRESSION(S) / ED DIAGNOSES  Final diagnoses:  Strain of  abdominal muscle, initial encounter      NEW MEDICATIONS STARTED DURING THIS VISIT:  Discharge Medication List as of 03/29/2017  8:31 PM    START taking these medications   Details  meloxicam (MOBIC) 15 MG tablet Take 1 tablet (15 mg total) by mouth daily., Starting Tue 03/29/2017, Print    methocarbamol (ROBAXIN) 500 MG tablet Take 1 tablet (500 mg total) by mouth 4 (four) times daily., Starting Tue 03/29/2017, Print            This chart was dictated using voice recognition software/Dragon. Despite best efforts to proofread, errors can occur which can change the meaning. Any change was purely unintentional.    Racheal Patches, PA-C 03/29/17 2054    Dionne Bucy, MD 03/29/17 2144

## 2017-08-10 ENCOUNTER — Ambulatory Visit: Payer: Self-pay | Admitting: Family Medicine

## 2017-09-01 ENCOUNTER — Telehealth: Payer: Self-pay | Admitting: Family Medicine

## 2017-09-01 NOTE — Telephone Encounter (Signed)
Unfortunately, as she hasn't established with me yet, I can't say if it is going to get better on it's own yet. She can try some stretches, but if it seems like it's getting worse or not getting better, I would advise her to be seen.

## 2017-09-01 NOTE — Telephone Encounter (Signed)
Copied from CRM [phone]. Topic: Quick Communication - See Telephone Encounter >> Sep 01, 2017  8:38 AM Lelon FrohlichGolden, Rylan Kaufmann, RMA wrote: CRM for notification. See Telephone encounter for:   09/01/17.pt called and stated that she has been having some pain from lower back shooting down her leg she thinks it may be sciatic and she wanted to know if she should make an appointment or if this is something that will get better on its own please call pt @[phone] 

## 2017-09-01 NOTE — Telephone Encounter (Signed)
Patient notified

## 2017-09-01 NOTE — Telephone Encounter (Signed)
Routing to provider  

## 2017-09-19 ENCOUNTER — Ambulatory Visit: Payer: Self-pay

## 2017-09-19 NOTE — Telephone Encounter (Signed)
I honestly don't think I can see her any sooner than April. Please tell her about the [facility] if she would like to see them before me. We can try to schedule her earlier in April if she's later in April, or she can try to see Elnita Maxwellheryl or Fleet Contrasachel.

## 2017-09-19 NOTE — Telephone Encounter (Signed)
Pt. Reports she has had low back pain since the beginning of Feb.States she was bending over to get something out of the freezer and her back started to hurt. States the pain radiates down through her left buttock. Has a new pt. Appointment scheduled for April, but doesn't feel like she can wait that long. Please advise. Pt. Has been to UC but is still hurting - has had Prednisone and Naprosyn. States she needs a referral to ortho, but needs a PCP to make that referral.  Reason for Disposition . [1] MODERATE back pain (e.g., interferes with normal activities) AND [2] present > 3 days  Answer Assessment - Initial Assessment Questions 1. ONSET: "When did the pain begin?"      Beginning of Feb. 2. LOCATION: "Where does it hurt?" (upper, mid or lower back)    Left side of back 3. SEVERITY: "How bad is the pain?"  (e.g., Scale 1-10; mild, moderate, or severe)   - MILD (1-3): doesn't interfere with normal activities    - MODERATE (4-7): interferes with normal activities or awakens from sleep    - SEVERE (8-10): excruciating pain, unable to do any normal activities      At it's worse - 12 4. PATTERN: "Is the pain constant?" (e.g., yes, no; constant, intermittent)      Comes and goes 5. RADIATION: "Does the pain shoot into your legs or elsewhere?"     Radiates from spine to left buttock cheek and down the back of the leg. 6. CAUSE:  "What do you think is causing the back pain?"      Back injury 7. BACK OVERUSE:  "Any recent lifting of heavy objects, strenuous work or exercise?"     No 8. MEDICATIONS: "What have you taken so far for the pain?" (e.g., nothing, acetaminophen, NSAIDS)     Naprosen 9. NEUROLOGIC SYMPTOMS: "Do you have any weakness, numbness, or problems with bowel/bladder control?"     No 10. OTHER SYMPTOMS: "Do you have any other symptoms?" (e.g., fever, abdominal pain, burning with urination, blood in urine)       No 11. PREGNANCY: "Is there any chance you are pregnant?" (e.g.,  yes, no; LMP)       No  Protocols used: BACK PAIN-A-AH

## 2017-09-20 NOTE — Telephone Encounter (Signed)
Spoke with pt and advised her to go to [facility]. Verified that they indeed do take [employer] and gave her the hours of operation and location. She stated that she would give them a call. I confirmed appt with the pt and told her if there was anything else I could do to help her to give us a call. She said she appreciated all we were doing for her and would give [facility] a call.

## 2017-10-10 ENCOUNTER — Ambulatory Visit (INDEPENDENT_AMBULATORY_CARE_PROVIDER_SITE_OTHER): Admitting: Family Medicine

## 2017-10-10 ENCOUNTER — Encounter: Payer: Self-pay | Admitting: Family Medicine

## 2017-10-10 ENCOUNTER — Ambulatory Visit
Admission: RE | Admit: 2017-10-10 | Discharge: 2017-10-10 | Disposition: A | Discharge status: Home / Self Care | Source: Ambulatory Visit | Attending: Family Medicine | Admitting: Family Medicine

## 2017-10-10 ENCOUNTER — Telehealth: Payer: Self-pay | Admitting: Family Medicine

## 2017-10-10 VITALS — BP 163/104 | HR 91 | Temp 97.8°F | Ht 60.1 in | Wt 192.6 lb

## 2017-10-10 DIAGNOSIS — F418 Other specified anxiety disorders: Secondary | ICD-10-CM | POA: Diagnosis not present

## 2017-10-10 DIAGNOSIS — M5442 Lumbago with sciatica, left side: Secondary | ICD-10-CM

## 2017-10-10 MED ORDER — METAXALONE 800 MG PO TABS: 800.0000 mg | ORAL_TABLET | Freq: Three times a day (TID) | ORAL | 1 refills | Status: DC

## 2017-10-10 MED ORDER — NAPROXEN 500 MG PO TABS: 500.0000 mg | ORAL_TABLET | Freq: Two times a day (BID) | ORAL | 3 refills | Status: DC

## 2017-10-10 MED ORDER — HYDROXYZINE HCL 25 MG PO TABS: 12.5000 mg | ORAL_TABLET | Freq: Three times a day (TID) | ORAL | 1 refills | Status: DC | PRN

## 2017-10-10 NOTE — Patient Instructions (Addendum)
.The Mindfulness App.  .Headspace.  .Calm.  Marland KitchenMINDBODY.  .Buddhify.  .Insight Timer.  .Smiling Mind.  .Meditation Timer Pro.  Michaell Cowing   [facility]: [phone] Sciatica Rehab Ask your health care provider which exercises are safe for you. Do exercises exactly as told by your health care provider and adjust them as directed. It is normal to feel mild stretching, pulling, tightness, or discomfort as you do these exercises, but you should stop right away if you feel sudden pain or your pain gets worse.Do not begin these exercises until told by your health care provider. Stretching and range of motion exercises These exercises warm up your muscles and joints and improve the movement and flexibility of your hips and your back. These exercises also help to relieve pain, numbness, and tingling. Exercise A: Sciatic nerve glide 1. Sit in a chair with your head facing down toward your chest. Place your hands behind your back. Let your shoulders slump forward. 2. Slowly straighten one of your knees while you tilt your head back as if you are looking toward the ceiling. Only straighten your leg as far as you can without making your symptoms worse. 3. Hold for __________ seconds. 4. Slowly return to the starting position. 5. Repeat with your other leg. Repeat __________ times. Complete this exercise __________ times a day. Exercise B: Knee to chest with hip adduction and internal rotation  1. Lie on your back on a firm surface with both legs straight. 2. Bend one of your knees and move it up toward your chest until you feel a gentle stretch in your lower back and buttock. Then, move your knee toward the shoulder that is on the opposite side from your leg. ? Hold your leg in this position by holding onto the front of your knee. 3. Hold for __________ seconds. 4. Slowly return to the starting position. 5. Repeat with your other leg. Repeat __________ times. Complete this exercise __________ times a  day. Exercise C: Prone extension on elbows  1. Lie on your abdomen on a firm surface. A bed may be too soft for this exercise. 2. Prop yourself up on your elbows. 3. Use your arms to help lift your chest up until you feel a gentle stretch in your abdomen and your lower back. ? This will place some of your body weight on your elbows. If this is uncomfortable, try stacking pillows under your chest. ? Your hips should stay down, against the surface that you are lying on. Keep your hip and back muscles relaxed. 4. Hold for __________ seconds. 5. Slowly relax your upper body and return to the starting position. Repeat __________ times. Complete this exercise __________ times a day. Strengthening exercises These exercises build strength and endurance in your back. Endurance is the ability to use your muscles for a long time, even after they get tired. Exercise D: Pelvic tilt 1. Lie on your back on a firm surface. Bend your knees and keep your feet flat. 2. Tense your abdominal muscles. Tip your pelvis up toward the ceiling and flatten your lower back into the floor. ? To help with this exercise, you may place a small towel under your lower back and try to push your back into the towel. 3. Hold for __________ seconds. 4. Let your muscles relax completely before you repeat this exercise. Repeat __________ times. Complete this exercise __________ times a day. Exercise E: Alternating arm and leg raises  1. Get on your hands and knees on a firm  surface. If you are on a hard floor, you may want to use padding to cushion your knees, such as an exercise mat. 2. Line up your arms and legs. Your hands should be below your shoulders, and your knees should be below your hips. 3. Lift your left leg behind you. At the same time, raise your right arm and straighten it in front of you. ? Do not lift your leg higher than your hip. ? Do not lift your arm higher than your shoulder. ? Keep your abdominal and back  muscles tight. ? Keep your hips facing the ground. ? Do not arch your back. ? Keep your balance carefully, and do not hold your breath. 4. Hold for __________ seconds. 5. Slowly return to the starting position and repeat with your right leg and your left arm. Repeat __________ times. Complete this exercise __________ times a day. Posture and body mechanics  Body mechanics refers to the movements and positions of your body while you do your daily activities. Posture is part of body mechanics. Good posture and healthy body mechanics can help to relieve stress in your body's tissues and joints. Good posture means that your spine is in its natural S-curve position (your spine is neutral), your shoulders are pulled back slightly, and your head is not tipped forward. The following are general guidelines for applying improved posture and body mechanics to your everyday activities. Standing   When standing, keep your spine neutral and your feet about hip-width apart. Keep a slight bend in your knees. Your ears, shoulders, and hips should line up.  When you do a task in which you stand in one place for a long time, place one foot up on a stable object that is 2-4 inches (5-10 cm) high, such as a footstool. This helps keep your spine neutral. Sitting   When sitting, keep your spine neutral and keep your feet flat on the floor. Use a footrest, if necessary, and keep your thighs parallel to the floor. Avoid rounding your shoulders, and avoid tilting your head forward.  When working at a desk or a computer, keep your desk at a height where your hands are slightly lower than your elbows. Slide your chair under your desk so you are close enough to maintain good posture.  When working at a computer, place your monitor at a height where you are looking straight ahead and you do not have to tilt your head forward or downward to look at the screen. Resting   When lying down and resting, avoid positions that  are most painful for you.  If you have pain with activities such as sitting, bending, stooping, or squatting (flexion-based activities), lie in a position in which your body does not bend very much. For example, avoid curling up on your side with your arms and knees near your chest (fetal position).  If you have pain with activities such as standing for a long time or reaching with your arms (extension-based activities), lie with your spine in a neutral position and bend your knees slightly. Try the following positions: ? Lying on your side with a pillow between your knees. ? Lying on your back with a pillow under your knees. Lifting   When lifting objects, keep your feet at least shoulder-width apart and tighten your abdominal muscles.  Bend your knees and hips and keep your spine neutral. It is important to lift using the strength of your legs, not your back. Do not lock your knees  straight out.  Always ask for help to lift heavy or awkward objects. This information is not intended to replace advice given to you by your health care provider. Make sure you discuss any questions you have with your health care provider. Document Released: 06/21/2005 Document Revised: 02/26/2016 Document Reviewed: 03/07/2015 Elsevier Interactive Patient Education  2018 [employer]   DASH Eating Plan DASH stands for "Dietary Approaches to Stop Hypertension." The DASH eating plan is a healthy eating plan that has been shown to reduce high blood pressure (hypertension). It may also reduce your risk for type 2 diabetes, heart disease, and stroke. The DASH eating plan may also help with weight loss. What are tips for following this plan? General guidelines  Avoid eating more than 2,300 mg (milligrams) of salt (sodium) a day. If you have hypertension, you may need to reduce your sodium intake to 1,500 mg a day.  Limit alcohol intake to no more than 1 drink a day for nonpregnant women and 2 drinks a day for men.  One drink equals 12 oz of beer, 5 oz of wine, or 1 oz of hard liquor.  Work with your health care provider to maintain a healthy body weight or to lose weight. Ask what an ideal weight is for you.  Get at least 30 minutes of exercise that causes your heart to beat faster (aerobic exercise) most days of the week. Activities may include walking, swimming, or biking.  Work with your health care provider or diet and nutrition specialist (dietitian) to adjust your eating plan to your individual calorie needs. Reading food labels  Check food labels for the amount of sodium per serving. Choose foods with less than 5 percent of the Daily Value of sodium. Generally, foods with less than 300 mg of sodium per serving fit into this eating plan.  To find whole grains, look for the word "whole" as the first word in the ingredient list. Shopping  Buy products labeled as "low-sodium" or "no salt added."  Buy fresh foods. Avoid canned foods and premade or frozen meals. Cooking  Avoid adding salt when cooking. Use salt-free seasonings or herbs instead of table salt or sea salt. Check with your health care provider or pharmacist before using salt substitutes.  Do not fry foods. Cook foods using healthy methods such as baking, boiling, grilling, and broiling instead.  Cook with heart-healthy oils, such as olive, canola, soybean, or sunflower oil. Meal planning   Eat a balanced diet that includes: ? 5 or more servings of fruits and vegetables each day. At each meal, try to fill half of your plate with fruits and vegetables. ? Up to 6-8 servings of whole grains each day. ? Less than 6 oz of lean meat, poultry, or fish each day. A 3-oz serving of meat is about the same size as a deck of cards. One egg equals 1 oz. ? 2 servings of low-fat dairy each day. ? A serving of nuts, seeds, or beans 5 times each week. ? Heart-healthy fats. Healthy fats called Omega-3 fatty acids are found in foods such as flaxseeds  and coldwater fish, like sardines, salmon, and mackerel.  Limit how much you eat of the following: ? Canned or prepackaged foods. ? Food that is high in trans fat, such as fried foods. ? Food that is high in saturated fat, such as fatty meat. ? Sweets, desserts, sugary drinks, and other foods with added sugar. ? Full-fat dairy products.  Do not salt foods before eating.  Try to eat at least 2 vegetarian meals each week.  Eat more home-cooked food and less restaurant, buffet, and fast food.  When eating at a restaurant, ask that your food be prepared with less salt or no salt, if possible. What foods are recommended? The items listed may not be a complete list. Talk with your dietitian about what dietary choices are best for you. Grains Whole-grain or whole-wheat bread. Whole-grain or whole-wheat pasta. Brown rice. Orpah Cobb. Bulgur. Whole-grain and low-sodium cereals. Pita bread. Low-fat, low-sodium crackers. Whole-wheat flour tortillas. Vegetables Fresh or frozen vegetables (raw, steamed, roasted, or grilled). Low-sodium or reduced-sodium tomato and vegetable juice. Low-sodium or reduced-sodium tomato sauce and tomato paste. Low-sodium or reduced-sodium canned vegetables. Fruits All fresh, dried, or frozen fruit. Canned fruit in natural juice (without added sugar). Meat and other protein foods Skinless chicken or [place]. Ground chicken or [place]. Pork with fat trimmed off. Fish and seafood. Egg whites. Dried beans, peas, or lentils. Unsalted nuts, nut butters, and seeds. Unsalted canned beans. Lean cuts of beef with fat trimmed off. Low-sodium, lean deli meat. Dairy Low-fat (1%) or fat-free (skim) milk. Fat-free, low-fat, or reduced-fat cheeses. Nonfat, low-sodium ricotta or cottage cheese. Low-fat or nonfat yogurt. Low-fat, low-sodium cheese. Fats and oils Soft margarine without trans fats. Vegetable oil. Low-fat, reduced-fat, or light mayonnaise and salad dressings  (reduced-sodium). Canola, safflower, olive, soybean, and sunflower oils. Avocado. Seasoning and other foods Herbs. Spices. Seasoning mixes without salt. Unsalted popcorn and pretzels. Fat-free sweets. What foods are not recommended? The items listed may not be a complete list. Talk with your dietitian about what dietary choices are best for you. Grains Baked goods made with fat, such as croissants, muffins, or some breads. Dry pasta or rice meal packs. Vegetables Creamed or fried vegetables. Vegetables in a cheese sauce. Regular canned vegetables (not low-sodium or reduced-sodium). Regular canned tomato sauce and paste (not low-sodium or reduced-sodium). Regular tomato and vegetable juice (not low-sodium or reduced-sodium). Rosita Fire. Olives. Fruits Canned fruit in a light or heavy syrup. Fried fruit. Fruit in cream or butter sauce. Meat and other protein foods Fatty cuts of meat. Ribs. Fried meat. Tomasa Blase. Sausage. Bologna and other processed lunch meats. Salami. Fatback. Hotdogs. Bratwurst. Salted nuts and seeds. Canned beans with added salt. Canned or smoked fish. Whole eggs or egg yolks. Chicken or [place] with skin. Dairy Whole or 2% milk, cream, and half-and-half. Whole or full-fat cream cheese. Whole-fat or sweetened yogurt. Full-fat cheese. Nondairy creamers. Whipped toppings. Processed cheese and cheese spreads. Fats and oils Butter. Stick margarine. Lard. Shortening. Ghee. Bacon fat. Tropical oils, such as coconut, palm kernel, or palm oil. Seasoning and other foods Salted popcorn and pretzels. Onion salt, garlic salt, seasoned salt, table salt, and sea salt. Worcestershire sauce. Tartar sauce. Barbecue sauce. Teriyaki sauce. Soy sauce, including reduced-sodium. Steak sauce. Canned and packaged gravies. Fish sauce. Oyster sauce. Cocktail sauce. Horseradish that you find on the shelf. Ketchup. Mustard. Meat flavorings and tenderizers. Bouillon cubes. Hot sauce and Tabasco sauce. Premade or  packaged marinades. Premade or packaged taco seasonings. Relishes. Regular salad dressings. Where to find more information:  National Heart, Lung, and Blood Institute: PopSteam.is  American Heart Association: www.heart.org Summary  The DASH eating plan is a healthy eating plan that has been shown to reduce high blood pressure (hypertension). It may also reduce your risk for type 2 diabetes, heart disease, and stroke.  With the DASH eating plan, you should limit salt (sodium) intake to 2,300 mg a day. If  you have hypertension, you may need to reduce your sodium intake to 1,500 mg a day.  When on the DASH eating plan, aim to eat more fresh fruits and vegetables, whole grains, lean proteins, low-fat dairy, and heart-healthy fats.  Work with your health care provider or diet and nutrition specialist (dietitian) to adjust your eating plan to your individual calorie needs. This information is not intended to replace advice given to you by your health care provider. Make sure you discuss any questions you have with your health care provider. Document Released: 06/10/2011 Document Revised: 06/14/2016 Document Reviewed: 06/14/2016 Elsevier Interactive Patient Education  [employer].

## 2017-10-10 NOTE — Telephone Encounter (Signed)
Please let her know that she has a little bit of disc space narrowing at the bottom of her spine. This may or may not be causing some of her symptoms. We'll get her into PT and see if it helps. They should be calling her shortly to get that set up. The Rx for it is up front (Sorry I forgot to give it to her at her appointment) But we've faxed it, so she shouldn't need it.

## 2017-10-10 NOTE — Telephone Encounter (Signed)
Called and left a message for patient to return my call to get her xray results and recommendations from Dr.Johnson.  [facility].

## 2017-10-10 NOTE — Progress Notes (Signed)
BP (!) 163/104 (BP Location: Left Arm, Patient Position: Sitting, Cuff Size: Normal)   Pulse 91   Temp 97.8 F (36.6 C)   Ht 5' 0.1" (1.527 m)   Wt 192 lb 9 oz (87.3 kg)   LMP 09/26/2017 (Approximate)   SpO2 99%   BMI 37.48 kg/m    Subjective:    Patient ID: Cynthia Barnes, female    DOB: [birth year], 37 y.o.   MRN: [id number]  HPI: Cynthia Barnes is a 37 y.o. female who presents today to establish care  Chief Complaint  Patient presents with  . Back Pain   BACK PAIN Duration: Since February (6 weeks) Mechanism of injury: lifting and twisted Location: Left and low back Onset: sudden Severity: severe Quality: sharp, shooting, aching, dull- changes with different activities  Frequency: constant but waxing and waning Radiation: L leg below the knee Aggravating factors: certain movements Alleviating factors: nothing Status: stable Treatments attempted: rest, ice, heat, APAP, ibuprofen, aleve and HEP  Relief with NSAIDs?: mild Nighttime pain:  no Paresthesias / decreased sensation:  yes Bowel / bladder incontinence:  no Fevers:  no Dysuria / urinary frequency:  no  ANXIETY/STRESS Duration:uncontrolled Anxious mood: yes  Excessive worrying: yes Irritability: yes  Sweating: no Nausea: no Palpitations:no Hyperventilation: no Panic attacks: no Agoraphobia: no  Obscessions/compulsions: no Depressed mood: no Depression screen PHQ 2/9 10/10/2017  Decreased Interest 0  Down, Depressed, Hopeless 0  PHQ - 2 Score 0   Anhedonia: no Weight changes: no Insomnia: no   Hypersomnia: no Fatigue/loss of energy: no Feelings of worthlessness: no Feelings of guilt: no Impaired concentration/indecisiveness: no Suicidal ideations: no  Crying spells: no Recent Stressors/Life Changes: yes   Relationship problems: no   Family stress: yes     Financial stress: yes    Job stress: yes    Recent death/loss: no   Active Ambulatory Problems    Diagnosis Date  Noted  . Situational anxiety 10/10/2017   Resolved Ambulatory Problems    Diagnosis Date Noted  . Hypotension 08/13/2016   Past Medical History:  Diagnosis Date  . Hypotension 08/13/2016  . Meningitis    Past Surgical History:  Procedure Laterality Date  . APPENDECTOMY    . CESAREAN SECTION    . KNEE SURGERY Left   . SHOULDER SURGERY Right   . TONSILLECTOMY     No medication comments found.  Allergies  Allergen Reactions  . Codeine Other (See Comments)    Pt states her "eyes fixated upwards"   Social History   Socioeconomic History  . Marital status: Married    Spouse name: Not on file  . Number of children: Not on file  . Years of education: Not on file  . Highest education level: Not on file  Occupational History  . Not on file  Social Needs  . Financial resource strain: Not on file  . Food insecurity:    Worry: Not on file    Inability: Not on file  . Transportation needs:    Medical: Not on file    Non-medical: Not on file  Tobacco Use  . Smoking status: Current Every Day Smoker    Packs/day: 0.50    Types: Cigarettes  . Smokeless tobacco: Never Used  Substance and Sexual Activity  . Alcohol use: Yes    Comment: Pt states drinking "ocassionally"  . Drug use: No  . Sexual activity: Yes    Birth control/protection: IUD  Lifestyle  . Physical activity:    Days  per week: Not on file    Minutes per session: Not on file  . Stress: Not on file  Relationships  . Social connections:    Talks on phone: Not on file    Gets together: Not on file    Attends religious service: Not on file    Active member of club or organization: Not on file    Attends meetings of clubs or organizations: Not on file    Relationship status: Not on file  . Intimate partner violence:    Fear of current or ex partner: Not on file    Emotionally abused: Not on file    Physically abused: Not on file    Forced sexual activity: Not on file  Other Topics Concern  . Not on file    Social History Narrative  . Not on file   Family History  Problem Relation Age of Onset  . Hyperlipidemia Mother   . Thyroid disease Mother   . Hyperlipidemia Father   . Hypertension Father   . Aneurysm Father        Brain  . Hyperlipidemia Sister   . Hypertension Sister   . Hypertension Brother   . Heart attack Brother     Review of Systems  Constitutional: Negative.   Respiratory: Negative.   Cardiovascular: Negative.   Musculoskeletal: Positive for back pain and myalgias. Negative for arthralgias, gait problem, joint swelling, neck pain and neck stiffness.  Skin: Negative.   Neurological: Positive for numbness. Negative for dizziness, tremors, seizures, syncope, facial asymmetry, speech difficulty, weakness, light-headedness and headaches.  Psychiatric/Behavioral: Negative for agitation, behavioral problems, confusion, decreased concentration, dysphoric mood, hallucinations, self-injury, sleep disturbance and suicidal ideas. The patient is nervous/anxious. The patient is not hyperactive.     Per HPI unless specifically indicated above     Objective:    BP (!) 163/104 (BP Location: Left Arm, Patient Position: Sitting, Cuff Size: Normal)   Pulse 91   Temp 97.8 F (36.6 C)   Ht 5' 0.1" (1.527 m)   Wt 192 lb 9 oz (87.3 kg)   LMP 09/26/2017 (Approximate)   SpO2 99%   BMI 37.48 kg/m   Wt Readings from Last 3 Encounters:  10/10/17 192 lb 9 oz (87.3 kg)  03/29/17 160 lb (72.6 kg)  08/13/16 160 lb (72.6 kg)    Physical Exam  Constitutional: She is oriented to person, place, and time. She appears well-developed and well-nourished. No distress.  HENT:  Head: Normocephalic and atraumatic.  Right Ear: Hearing normal.  Left Ear: Hearing normal.  Nose: Nose normal.  Eyes: Conjunctivae and lids are normal. Right eye exhibits no discharge. Left eye exhibits no discharge. No scleral icterus.  Cardiovascular: Normal rate, regular rhythm, normal heart sounds and intact distal  pulses. Exam reveals no gallop and no friction rub.  No murmur heard. Pulmonary/Chest: Effort normal and breath sounds normal. No respiratory distress. She has no wheezes. She has no rales. She exhibits no tenderness.  Musculoskeletal: Normal range of motion.  Neurological: She is alert and oriented to person, place, and time.  Skin: Skin is warm, dry and intact. No rash noted. She is not diaphoretic. No erythema. No pallor.  Psychiatric: She has a normal mood and affect. Her speech is normal and behavior is normal. Judgment and thought content normal. Cognition and memory are normal.  Nursing note and vitals reviewed. Back Exam:    Inspection:  Normal spinal curvature.  No deformity, ecchymosis, erythema, or lesions  Palpation:     Midline spinal tenderness: no coccygeal      Paralumbar tenderness: yes Left     Parathoracic tenderness: no      Buttocks tenderness: yesLeft     Range of Motion:      Flexion: Fingers to Knees     Extension:Decreased     Lateral bending:Decreased    Rotation:Decreased    Neuro Exam:Lower extremity DTRs normal & symmetric.  Strength and sensation intact.    Special Tests:      Straight leg raise:positive  Results for orders placed or performed during the hospital encounter of 08/13/16  Culture, blood (Routine X 2) w Reflex to ID Panel  Result Value Ref Range   Specimen Description BLOOD LEFT WRIST    Special Requests BOTTLES DRAWN AEROBIC AND ANAEROBIC AER9ML ANA7ML    Culture NO GROWTH 5 DAYS    Report Status 08/18/2016 FINAL   Culture, blood (Routine X 2) w Reflex to ID Panel  Result Value Ref Range   Specimen Description BLOOD RIGHT ASSIST CONTROL    Special Requests BOTTLES DRAWN AEROBIC AND ANAEROBIC ANA6ML AER5ML    Culture  Setup Time      GRAM POSITIVE COCCI ANAEROBIC BOTTLE ONLY CRITICAL RESULT CALLED TO, READ BACK BY AND VERIFIED WITH: JASON ROBBINS ON 08/14/16 AT [address] [facility] [address]    Culture (A)     STAPHYLOCOCCUS SPECIES (COAGULASE  NEGATIVE) THE SIGNIFICANCE OF ISOLATING THIS ORGANISM FROM A SINGLE SET OF BLOOD CULTURES WHEN MULTIPLE SETS ARE DRAWN IS UNCERTAIN. PLEASE NOTIFY THE MICROBIOLOGY DEPARTMENT WITHIN ONE WEEK IF SPECIATION AND SENSITIVITIES ARE REQUIRED. Performed at [facility], [address], [place], [place] [address]    Report Status 08/17/2016 FINAL   Urine culture  Result Value Ref Range   Specimen Description URINE, RANDOM    Special Requests NONE    Culture MULTIPLE SPECIES PRESENT, SUGGEST RECOLLECTION (A)    Report Status 08/15/2016 FINAL   Blood Culture ID Panel (Reflexed)  Result Value Ref Range   Enterococcus species NOT DETECTED NOT DETECTED   Listeria monocytogenes NOT DETECTED NOT DETECTED   Staphylococcus species DETECTED (A) NOT DETECTED   Staphylococcus aureus NOT DETECTED NOT DETECTED   Methicillin resistance DETECTED (A) NOT DETECTED   Streptococcus species NOT DETECTED NOT DETECTED   Streptococcus agalactiae NOT DETECTED NOT DETECTED   Streptococcus pneumoniae NOT DETECTED NOT DETECTED   Streptococcus pyogenes NOT DETECTED NOT DETECTED   Acinetobacter baumannii NOT DETECTED NOT DETECTED   Enterobacteriaceae species NOT DETECTED NOT DETECTED   Enterobacter cloacae complex NOT DETECTED NOT DETECTED   Escherichia coli NOT DETECTED NOT DETECTED   Klebsiella oxytoca NOT DETECTED NOT DETECTED   Klebsiella pneumoniae NOT DETECTED NOT DETECTED   Proteus species NOT DETECTED NOT DETECTED   Serratia marcescens NOT DETECTED NOT DETECTED   Haemophilus influenzae NOT DETECTED NOT DETECTED   Neisseria meningitidis NOT DETECTED NOT DETECTED   Pseudomonas aeruginosa NOT DETECTED NOT DETECTED   Candida albicans NOT DETECTED NOT DETECTED   Candida glabrata NOT DETECTED NOT DETECTED   Candida krusei NOT DETECTED NOT DETECTED   Candida parapsilosis NOT DETECTED NOT DETECTED   Candida tropicalis NOT DETECTED NOT DETECTED  CBC  Result Value Ref Range   WBC 8.2 3.6 - 11.0 K/uL   RBC  4.14 3.80 - 5.20 MIL/uL   Hemoglobin 14.1 12.0 - 16.0 g/dL   HCT 60.4 54.0 - 98.1 %   MCV 96.3 80.0 - 100.0 fL   MCH 34.0 26.0 -  34.0 pg   MCHC 35.3 32.0 - 36.0 g/dL   RDW 11.912.8 14.711.5 - 82.914.5 %   Platelets 267 150 - 440 K/uL  Lactic acid, plasma  Result Value Ref Range   Lactic Acid, Venous 4.6 (HH) 0.5 - 1.9 mmol/L  Comprehensive metabolic panel  Result Value Ref Range   Sodium 140 135 - 145 mmol/L   Potassium 3.2 (L) 3.5 - 5.1 mmol/L   Chloride 104 101 - 111 mmol/L   CO2 19 (L) 22 - 32 mmol/L   Glucose, Bld 142 (H) 65 - 99 mg/dL   BUN 16 6 - 20 mg/dL   Creatinine, Ser 5.620.75 0.44 - 1.00 mg/dL   Calcium 9.2 8.9 - 13.010.3 mg/dL   Total Protein 7.7 6.5 - 8.1 g/dL   Albumin 4.6 3.5 - 5.0 g/dL   AST 25 15 - 41 U/L   ALT 18 14 - 54 U/L   Alkaline Phosphatase 59 38 - 126 U/L   Total Bilirubin 0.5 0.3 - 1.2 mg/dL   GFR calc non Af Amer >60 >60 mL/min   GFR calc Af Amer >60 >60 mL/min   Anion gap 17 (H) 5 - 15  Lipase, blood  Result Value Ref Range   Lipase 64 (H) 11 - 51 U/L  Troponin I  Result Value Ref Range   Troponin I <0.03 <0.03 ng/mL  hCG, quantitative, pregnancy  Result Value Ref Range   hCG, Beta Chain, Quant, S <1 <5 mIU/mL  Urinalysis, Complete w Microscopic  Result Value Ref Range   Color, Urine YELLOW (A) YELLOW   APPearance HAZY (A) CLEAR   Specific Gravity, Urine 1.012 1.005 - 1.030   pH 6.0 5.0 - 8.0   Glucose, UA 50 (A) NEGATIVE mg/dL   Hgb urine dipstick SMALL (A) NEGATIVE   Bilirubin Urine NEGATIVE NEGATIVE   Ketones, ur 5 (A) NEGATIVE mg/dL   Protein, ur 865100 (A) NEGATIVE mg/dL   Nitrite NEGATIVE NEGATIVE   Leukocytes, UA TRACE (A) NEGATIVE   RBC / HPF 0-5 0 - 5 RBC/hpf   WBC, UA 6-30 0 - 5 WBC/hpf   Bacteria, UA FEW (A) NONE SEEN   Squamous Epithelial / LPF 0-5 (A) NONE SEEN   Mucus PRESENT   Influenza panel by PCR (type A & B)  Result Value Ref Range   Influenza A By PCR NEGATIVE NEGATIVE   Influenza B By PCR NEGATIVE NEGATIVE  Ethanol  Result  Value Ref Range   Alcohol, Ethyl (B) 212 (H) <5 mg/dL  Acetaminophen level  Result Value Ref Range   Acetaminophen (Tylenol), Serum <10 (L) 10 - 30 ug/mL  Salicylate level  Result Value Ref Range   Salicylate Lvl <7.0 2.8 - 30.0 mg/dL  Urine Drug Screen, Qualitative ([facility] only)  Result Value Ref Range   Tricyclic, Ur Screen NONE DETECTED NONE DETECTED   Amphetamines, Ur Screen NONE DETECTED NONE DETECTED   MDMA (Ecstasy)Ur Screen NONE DETECTED NONE DETECTED   Cocaine Metabolite,Ur [place] NONE DETECTED NONE DETECTED   Opiate, Ur Screen NONE DETECTED NONE DETECTED   Phencyclidine (PCP) Ur S NONE DETECTED NONE DETECTED   Cannabinoid 50 Ng, Ur [place] NONE DETECTED NONE DETECTED   Barbiturates, Ur Screen NONE DETECTED NONE DETECTED   Benzodiazepine, Ur Scrn NONE DETECTED NONE DETECTED   Methadone Scn, Ur NONE DETECTED NONE DETECTED  Glucose, capillary  Result Value Ref Range   Glucose-Capillary 139 (H) 65 - 99 mg/dL  Lactic acid, plasma  Result Value Ref Range  Lactic Acid, Venous 4.1 (HH) 0.5 - 1.9 mmol/L  Magnesium  Result Value Ref Range   Magnesium 2.1 1.7 - 2.4 mg/dL  Lactic acid, plasma  Result Value Ref Range   Lactic Acid, Venous 2.5 (HH) 0.5 - 1.9 mmol/L  Lactic acid, plasma  Result Value Ref Range   Lactic Acid, Venous 2.4 (HH) 0.5 - 1.9 mmol/L  Basic metabolic panel  Result Value Ref Range   Sodium 138 135 - 145 mmol/L   Potassium 3.9 3.5 - 5.1 mmol/L   Chloride 112 (H) 101 - 111 mmol/L   CO2 19 (L) 22 - 32 mmol/L   Glucose, Bld 85 65 - 99 mg/dL   BUN 15 6 - 20 mg/dL   Creatinine, Ser 1.61 0.44 - 1.00 mg/dL   Calcium 8.0 (L) 8.9 - 10.3 mg/dL   GFR calc non Af Amer >60 >60 mL/min   GFR calc Af Amer >60 >60 mL/min   Anion gap 7 5 - 15  CBC  Result Value Ref Range   WBC 10.7 3.6 - 11.0 K/uL   RBC 3.31 (L) 3.80 - 5.20 MIL/uL   Hemoglobin 11.2 (L) 12.0 - 16.0 g/dL   HCT 09.6 (L) 04.5 - 40.9 %   MCV 97.0 80.0 - 100.0 fL   MCH 33.7 26.0 - 34.0 pg   MCHC 34.8 32.0 -  36.0 g/dL   RDW 81.1 91.4 - 78.2 %   Platelets 219 150 - 440 K/uL  Lactic acid, plasma  Result Value Ref Range   Lactic Acid, Venous 2.5 (HH) 0.5 - 1.9 mmol/L  Hemoglobin  Result Value Ref Range   Hemoglobin 11.7 (L) 12.0 - 16.0 g/dL      Assessment & Plan:   Problem List Items Addressed This Visit      Other   Situational anxiety    PRN hydroxyzine given. Will work on [employer]. Call if getting worse or not getting better. Call with any concerns.       Relevant Medications   hydrOXYzine (ATARAX/VISTARIL) 25 MG tablet    Other Visit Diagnoses    Acute left-sided low back pain with left-sided sciatica    -  Primary   Has been going on for 6 weeks now. Will check x-ray and get her into PT. Refills of meds given. Call with any concerns.    Relevant Medications   HYDROcodone-acetaminophen (NORCO/VICODIN) 5-325 MG tablet   naproxen (NAPROSYN) 500 MG tablet   metaxalone (SKELAXIN) 800 MG tablet   Other Relevant Orders   DG Lumbar Spine Complete (Completed)       Follow up plan: Return 2-3 weeks, for follow up back.

## 2017-10-10 NOTE — Assessment & Plan Note (Signed)
PRN hydroxyzine given. Will work on [employer]. Call if getting worse or not getting better. Call with any concerns.

## 2017-10-11 NOTE — Telephone Encounter (Signed)
Pt. Given results and Dr. Henriette CombsJohnson's instructions. Verbalizes understanding.

## 2017-10-26 ENCOUNTER — Telehealth: Payer: Self-pay

## 2017-10-26 NOTE — Telephone Encounter (Signed)
Copied from CRM [phone]. Topic: Referral - Request >> Oct 25, 2017  2:44 PM Cynthia AmatoBurton, Donna F wrote: Pt is requesting [employer] authorization for [facility] [place] [facility] [place]   Best number [phone]

## 2017-10-26 NOTE — Telephone Encounter (Signed)
Dr. Laural BenesJohnson,  Could you enter in a referral for PT in [place]? Have correct [employer] form to send.  It's for patient's back pain.

## 2017-10-27 NOTE — Telephone Encounter (Signed)
Order written up by Dr.Johnson, Ronney Astersiffany Cheek faxed it.

## 2017-10-27 NOTE — Telephone Encounter (Signed)
[place] the [place].  I had to have a referral in the system before I could try to authorize with [employer].

## 2017-10-27 NOTE — Telephone Encounter (Signed)
Order in, Please fax and let her know she can pick it up if she wants

## 2017-10-27 NOTE — Telephone Encounter (Signed)
Were you working on this?

## 2017-10-27 NOTE — Telephone Encounter (Signed)
[facility]

## 2017-10-27 NOTE — Telephone Encounter (Signed)
Is there anything that I need to do with this?

## 2017-10-31 ENCOUNTER — Ambulatory Visit: Admitting: Family Medicine

## 2017-11-01 NOTE — Telephone Encounter (Signed)
Cynthia Barnes from [facility] [phone]  Has question on the [employer] ID number on referral Please  Call her.

## 2017-11-01 NOTE — Telephone Encounter (Signed)
Please call her back, it's on the second sheet I faxed earlier this morning.

## 2017-11-01 NOTE — Telephone Encounter (Signed)
Received back that patient was [employer] ineligible. Spoke with [name]. Faxed that information. Patient needs to contact [employer] for eligibility and referrals.

## 2017-11-01 NOTE — Telephone Encounter (Signed)
[name] is still waiting on PA from [employer]. Pt had an appt this morning and they will call pt to reschedule.

## 2017-11-02 NOTE — Telephone Encounter (Signed)
° °  I spoke with [employer] and they said they never received a referral concerning this pt   [phone]

## 2017-11-02 NOTE — Telephone Encounter (Signed)
Form was refaxed this morning, and again at lunch.

## 2017-11-02 NOTE — Telephone Encounter (Signed)
Called patient on behalf of Keri to let her know that tricare said she was ineligible. Pt pointed out that her name on the P.A. From was "Celene Kras" that was corrected and refaxed to [employer].

## 2017-11-16 ENCOUNTER — Ambulatory Visit (INDEPENDENT_AMBULATORY_CARE_PROVIDER_SITE_OTHER): Admitting: Family Medicine

## 2017-11-16 ENCOUNTER — Encounter: Payer: Self-pay | Admitting: Family Medicine

## 2017-11-16 VITALS — BP 169/124 | HR 101 | Temp 98.6°F | Ht 60.2 in | Wt 191.1 lb

## 2017-11-16 DIAGNOSIS — G8929 Other chronic pain: Secondary | ICD-10-CM | POA: Diagnosis not present

## 2017-11-16 DIAGNOSIS — M5442 Lumbago with sciatica, left side: Secondary | ICD-10-CM

## 2017-11-16 MED ORDER — HYDROCODONE-ACETAMINOPHEN 5-325 MG PO TABS: 1.0000 | ORAL_TABLET | ORAL | 0 refills | Status: AC | PRN

## 2017-11-16 MED ORDER — KETOROLAC TROMETHAMINE 60 MG/2ML IM SOLN
60.0000 mg | Freq: Once | INTRAMUSCULAR | Status: AC
  Administered 2017-11-16: 60 mg via INTRAMUSCULAR

## 2017-11-16 NOTE — Progress Notes (Deleted)
There were no vitals taken for this visit.   Subjective:    Patient ID: Cynthia Barnes, female    DOB: 03/15/1981, 37 y.o.   MRN: [id number]  HPI: Cynthia Barnes is a 37 y.o. female presenting on 11/16/2017 for comprehensive medical examination. Current medical complaints include:{Blank single:19197::"none","***"}  She currently lives with: Menopausal Symptoms: {Blank single:19197::"yes","no"}  Depression Screen done today and results listed below:  Depression screen [facility] 2/9 10/10/2017  Decreased Interest 0  Down, Depressed, Hopeless 0  PHQ - 2 Score 0    Past Medical History:  Past Medical History:  Diagnosis Date  . Hypotension 08/13/2016  . Meningitis     Surgical History:  Past Surgical History:  Procedure Laterality Date  . APPENDECTOMY    . CESAREAN SECTION    . KNEE SURGERY Left   . SHOULDER SURGERY Right   . TONSILLECTOMY      Medications:  Current Outpatient Medications on File Prior to Visit  Medication Sig  . HYDROcodone-acetaminophen (NORCO/VICODIN) 5-325 MG tablet Take one tablet at night for pain; may take up to every 6 hours as needed for pain if not working or driving  . hydrOXYzine (ATARAX/VISTARIL) 25 MG tablet Take 0.5-1 tablets (12.5-25 mg total) by mouth 3 (three) times daily as needed.  Marland Kitchen ibuprofen (ADVIL,MOTRIN) 200 MG tablet Take 200 mg by mouth every 6 (six) hours as needed.  Marland Kitchen levonorgestrel (MIRENA, 52 MG,) 20 MCG/24HR IUD by Intrauterine route.  . metaxalone (SKELAXIN) 800 MG tablet Take 1 tablet (800 mg total) by mouth 3 (three) times daily.  . Multiple Vitamin (MULTIVITAMIN) tablet Take 1 tablet by mouth daily.  . naproxen (NAPROSYN) 500 MG tablet Take 1 tablet (500 mg total) by mouth 2 (two) times daily with a meal.   No current facility-administered medications on file prior to visit.     Allergies:  Allergies  Allergen Reactions  . Codeine Other (See Comments)    Pt states her "eyes fixated upwards"    Social  History:  Social History   Socioeconomic History  . Marital status: Married    Spouse name: Not on file  . Number of children: Not on file  . Years of education: Not on file  . Highest education level: Not on file  Occupational History  . Not on file  Social Needs  . Financial resource strain: Not on file  . Food insecurity:    Worry: Not on file    Inability: Not on file  . Transportation needs:    Medical: Not on file    Non-medical: Not on file  Tobacco Use  . Smoking status: Current Every Day Smoker    Packs/day: 0.50    Types: Cigarettes  . Smokeless tobacco: Never Used  Substance and Sexual Activity  . Alcohol use: Yes    Comment: Pt states drinking "ocassionally"  . Drug use: No  . Sexual activity: Yes    Birth control/protection: IUD  Lifestyle  . Physical activity:    Days per week: Not on file    Minutes per session: Not on file  . Stress: Not on file  Relationships  . Social connections:    Talks on phone: Not on file    Gets together: Not on file    Attends religious service: Not on file    Active member of club or organization: Not on file    Attends meetings of clubs or organizations: Not on file    Relationship status: Not on file  .  Intimate partner violence:    Fear of current or ex partner: Not on file    Emotionally abused: Not on file    Physically abused: Not on file    Forced sexual activity: Not on file  Other Topics Concern  . Not on file  Social History Narrative  . Not on file   Social History   Tobacco Use  Smoking Status Current Every Day Smoker  . Packs/day: 0.50  . Types: Cigarettes  Smokeless Tobacco Never Used   Social History   Substance and Sexual Activity  Alcohol Use Yes   Comment: Pt states drinking "ocassionally"    Family History:  Family History  Problem Relation Age of Onset  . Hyperlipidemia Mother   . Thyroid disease Mother   . Hyperlipidemia Father   . Hypertension Father   . Aneurysm Father         Brain  . Hyperlipidemia Sister   . Hypertension Sister   . Hypertension Brother   . Heart attack Brother     Past medical history, surgical history, medications, allergies, family history and social history reviewed with patient today and changes made to appropriate areas of the chart.   ROS All other ROS negative except what is listed above and in the HPI.      Objective:    There were no vitals taken for this visit.  Wt Readings from Last 3 Encounters:  10/10/17 192 lb 9 oz (87.3 kg)  03/29/17 160 lb (72.6 kg)  08/13/16 160 lb (72.6 kg)    Physical Exam  Results for orders placed or performed during the hospital encounter of 08/13/16  Culture, blood (Routine X 2) w Reflex to ID Panel  Result Value Ref Range   Specimen Description BLOOD LEFT WRIST    Special Requests BOTTLES DRAWN AEROBIC AND ANAEROBIC AER9ML ANA7ML    Culture NO GROWTH 5 DAYS    Report Status 08/18/2016 FINAL   Culture, blood (Routine X 2) w Reflex to ID Panel  Result Value Ref Range   Specimen Description BLOOD RIGHT ASSIST CONTROL    Special Requests BOTTLES DRAWN AEROBIC AND ANAEROBIC ANA6ML AER5ML    Culture  Setup Time      GRAM POSITIVE COCCI ANAEROBIC BOTTLE ONLY CRITICAL RESULT CALLED TO, READ BACK BY AND VERIFIED WITH: JASON ROBBINS ON 08/14/16 AT [address]    Culture (A)     STAPHYLOCOCCUS SPECIES (COAGULASE NEGATIVE) THE SIGNIFICANCE OF ISOLATING THIS ORGANISM FROM A SINGLE SET OF BLOOD CULTURES WHEN MULTIPLE SETS ARE DRAWN IS UNCERTAIN. PLEASE NOTIFY THE MICROBIOLOGY DEPARTMENT WITHIN ONE WEEK IF SPECIATION AND SENSITIVITIES ARE REQUIRED. Performed at [facility], [address], [place], [place] [address]    Report Status 08/17/2016 FINAL   Urine culture  Result Value Ref Range   Specimen Description URINE, RANDOM    Special Requests NONE    Culture MULTIPLE SPECIES PRESENT, SUGGEST RECOLLECTION (A)    Report Status 08/15/2016 FINAL   Blood Culture ID Panel (Reflexed)  Result  Value Ref Range   Enterococcus species NOT DETECTED NOT DETECTED   Listeria monocytogenes NOT DETECTED NOT DETECTED   Staphylococcus species DETECTED (A) NOT DETECTED   Staphylococcus aureus NOT DETECTED NOT DETECTED   Methicillin resistance DETECTED (A) NOT DETECTED   Streptococcus species NOT DETECTED NOT DETECTED   Streptococcus agalactiae NOT DETECTED NOT DETECTED   Streptococcus pneumoniae NOT DETECTED NOT DETECTED   Streptococcus pyogenes NOT DETECTED NOT DETECTED   Acinetobacter baumannii NOT DETECTED  NOT DETECTED   Enterobacteriaceae species NOT DETECTED NOT DETECTED   Enterobacter cloacae complex NOT DETECTED NOT DETECTED   Escherichia coli NOT DETECTED NOT DETECTED   Klebsiella oxytoca NOT DETECTED NOT DETECTED   Klebsiella pneumoniae NOT DETECTED NOT DETECTED   Proteus species NOT DETECTED NOT DETECTED   Serratia marcescens NOT DETECTED NOT DETECTED   Haemophilus influenzae NOT DETECTED NOT DETECTED   Neisseria meningitidis NOT DETECTED NOT DETECTED   Pseudomonas aeruginosa NOT DETECTED NOT DETECTED   Candida albicans NOT DETECTED NOT DETECTED   Candida glabrata NOT DETECTED NOT DETECTED   Candida krusei NOT DETECTED NOT DETECTED   Candida parapsilosis NOT DETECTED NOT DETECTED   Candida tropicalis NOT DETECTED NOT DETECTED  CBC  Result Value Ref Range   WBC 8.2 3.6 - 11.0 K/uL   RBC 4.14 3.80 - 5.20 MIL/uL   Hemoglobin 14.1 12.0 - 16.0 g/dL   HCT 16.1 09.6 - 04.5 %   MCV 96.3 80.0 - 100.0 fL   MCH 34.0 26.0 - 34.0 pg   MCHC 35.3 32.0 - 36.0 g/dL   RDW 40.9 81.1 - 91.4 %   Platelets 267 150 - 440 K/uL  Lactic acid, plasma  Result Value Ref Range   Lactic Acid, Venous 4.6 (HH) 0.5 - 1.9 mmol/L  Comprehensive metabolic panel  Result Value Ref Range   Sodium 140 135 - 145 mmol/L   Potassium 3.2 (L) 3.5 - 5.1 mmol/L   Chloride 104 101 - 111 mmol/L   CO2 19 (L) 22 - 32 mmol/L   Glucose, Bld 142 (H) 65 - 99 mg/dL   BUN 16 6 - 20 mg/dL   Creatinine, Ser 7.82 0.44 -  1.00 mg/dL   Calcium 9.2 8.9 - 95.6 mg/dL   Total Protein 7.7 6.5 - 8.1 g/dL   Albumin 4.6 3.5 - 5.0 g/dL   AST 25 15 - 41 U/L   ALT 18 14 - 54 U/L   Alkaline Phosphatase 59 38 - 126 U/L   Total Bilirubin 0.5 0.3 - 1.2 mg/dL   GFR calc non Af Amer >60 >60 mL/min   GFR calc Af Amer >60 >60 mL/min   Anion gap 17 (H) 5 - 15  Lipase, blood  Result Value Ref Range   Lipase 64 (H) 11 - 51 U/L  Troponin I  Result Value Ref Range   Troponin I <0.03 <0.03 ng/mL  hCG, quantitative, pregnancy  Result Value Ref Range   hCG, Beta Chain, Quant, S <1 <5 mIU/mL  Urinalysis, Complete w Microscopic  Result Value Ref Range   Color, Urine YELLOW (A) YELLOW   APPearance HAZY (A) CLEAR   Specific Gravity, Urine 1.012 1.005 - 1.030   pH 6.0 5.0 - 8.0   Glucose, UA 50 (A) NEGATIVE mg/dL   Hgb urine dipstick SMALL (A) NEGATIVE   Bilirubin Urine NEGATIVE NEGATIVE   Ketones, ur 5 (A) NEGATIVE mg/dL   Protein, ur 213 (A) NEGATIVE mg/dL   Nitrite NEGATIVE NEGATIVE   Leukocytes, UA TRACE (A) NEGATIVE   RBC / HPF 0-5 0 - 5 RBC/hpf   WBC, UA 6-30 0 - 5 WBC/hpf   Bacteria, UA FEW (A) NONE SEEN   Squamous Epithelial / LPF 0-5 (A) NONE SEEN   Mucus PRESENT   Influenza panel by PCR (type A & B)  Result Value Ref Range   Influenza A By PCR NEGATIVE NEGATIVE   Influenza B By PCR NEGATIVE NEGATIVE  Ethanol  Result Value Ref Range  Alcohol, Ethyl (B) 212 (H) <5 mg/dL  Acetaminophen level  Result Value Ref Range   Acetaminophen (Tylenol), Serum <10 (L) 10 - 30 ug/mL  Salicylate level  Result Value Ref Range   Salicylate Lvl <7.0 2.8 - 30.0 mg/dL  Urine Drug Screen, Qualitative ([facility] only)  Result Value Ref Range   Tricyclic, Ur Screen NONE DETECTED NONE DETECTED   Amphetamines, Ur Screen NONE DETECTED NONE DETECTED   MDMA (Ecstasy)Ur Screen NONE DETECTED NONE DETECTED   Cocaine Metabolite,Ur [place] NONE DETECTED NONE DETECTED   Opiate, Ur Screen NONE DETECTED NONE DETECTED   Phencyclidine (PCP) Ur S  NONE DETECTED NONE DETECTED   Cannabinoid 50 Ng, Ur [place] NONE DETECTED NONE DETECTED   Barbiturates, Ur Screen NONE DETECTED NONE DETECTED   Benzodiazepine, Ur Scrn NONE DETECTED NONE DETECTED   Methadone Scn, Ur NONE DETECTED NONE DETECTED  Glucose, capillary  Result Value Ref Range   Glucose-Capillary 139 (H) 65 - 99 mg/dL  Lactic acid, plasma  Result Value Ref Range   Lactic Acid, Venous 4.1 (HH) 0.5 - 1.9 mmol/L  Magnesium  Result Value Ref Range   Magnesium 2.1 1.7 - 2.4 mg/dL  Lactic acid, plasma  Result Value Ref Range   Lactic Acid, Venous 2.5 (HH) 0.5 - 1.9 mmol/L  Lactic acid, plasma  Result Value Ref Range   Lactic Acid, Venous 2.4 (HH) 0.5 - 1.9 mmol/L  Basic metabolic panel  Result Value Ref Range   Sodium 138 135 - 145 mmol/L   Potassium 3.9 3.5 - 5.1 mmol/L   Chloride 112 (H) 101 - 111 mmol/L   CO2 19 (L) 22 - 32 mmol/L   Glucose, Bld 85 65 - 99 mg/dL   BUN 15 6 - 20 mg/dL   Creatinine, Ser 1.61 0.44 - 1.00 mg/dL   Calcium 8.0 (L) 8.9 - 10.3 mg/dL   GFR calc non Af Amer >60 >60 mL/min   GFR calc Af Amer >60 >60 mL/min   Anion gap 7 5 - 15  CBC  Result Value Ref Range   WBC 10.7 3.6 - 11.0 K/uL   RBC 3.31 (L) 3.80 - 5.20 MIL/uL   Hemoglobin 11.2 (L) 12.0 - 16.0 g/dL   HCT 09.6 (L) 04.5 - 40.9 %   MCV 97.0 80.0 - 100.0 fL   MCH 33.7 26.0 - 34.0 pg   MCHC 34.8 32.0 - 36.0 g/dL   RDW 81.1 91.4 - 78.2 %   Platelets 219 150 - 440 K/uL  Lactic acid, plasma  Result Value Ref Range   Lactic Acid, Venous 2.5 (HH) 0.5 - 1.9 mmol/L  Hemoglobin  Result Value Ref Range   Hemoglobin 11.7 (L) 12.0 - 16.0 g/dL      Assessment & Plan:   Problem List Items Addressed This Visit    None       Follow up plan: No follow-ups on file.   LABORATORY TESTING:  - Pap smear: {Blank single:19197::"pap done","not applicable","up to date","done elsewhere"}  IMMUNIZATIONS:   - Tdap: Tetanus vaccination status reviewed: last tetanus booster within 10 years. - Influenza:  Up to date - Pneumovax: Not applicable - Prevnar: Not applicable - HPV: {Blank single:19197::"Up to date","Administered today","Not applicable","Refused","Given elsewhere"} - Zostavax vaccine: Not applicable  PATIENT COUNSELING:   Advised to take 1 mg of folate supplement per day if capable of pregnancy.   Sexuality: Discussed sexually transmitted diseases, partner selection, use of condoms, avoidance of unintended pregnancy  and contraceptive alternatives.   Advised to avoid  cigarette smoking.  I discussed with the patient that most people either abstain from alcohol or drink within safe limits (<=14/week and <=4 drinks/occasion for males, <=7/weeks and <= 3 drinks/occasion for females) and that the risk for alcohol disorders and other health effects rises proportionally with the number of drinks per week and how often a drinker exceeds daily limits.  Discussed cessation/primary prevention of drug use and availability of treatment for abuse.   Diet: Encouraged to adjust caloric intake to maintain  or achieve ideal body weight, to reduce intake of dietary saturated fat and total fat, to limit sodium intake by avoiding high sodium foods and not adding table salt, and to maintain adequate dietary potassium and calcium preferably from fresh fruits, vegetables, and low-fat dairy products.    stressed the importance of regular exercise  Injury prevention: Discussed safety belts, safety helmets, smoke detector, smoking near bedding or upholstery.   Dental health: Discussed importance of regular tooth brushing, flossing, and dental visits.    NEXT PREVENTATIVE PHYSICAL DUE IN 1 YEAR. No follow-ups on file.

## 2017-11-16 NOTE — Progress Notes (Signed)
BP (!) 169/124 (BP Location: Left Arm, Patient Position: Sitting, Cuff Size: Normal)   Pulse (!) 101   Temp 98.6 F (37 C)   Ht 5' 0.2" (1.529 m)   Wt 191 lb 1 oz (86.7 kg)   SpO2 99%   BMI 37.07 kg/m    Subjective:    Patient ID: Cynthia Barnes, female    DOB: February 01, 1981, 37 y.o.   MRN: [id number]  HPI: Cynthia Barnes is a 37 y.o. female  Chief Complaint  Patient presents with  . Back Pain   Here today for her annual exam, but in severe pain in her back, so this was pushed off.   BACK PAIN Duration: 3 months, significantly worse over the past couple of days, Has been doing PT and notes that she is very sore following PT. Had dry needling yesterday and is very not comfortable. Has had 2 PTs so far- still working on it Mechanism of injury: Lifting and twisting Location: left low back Onset: sudden Severity: severe Quality: sharp, dull, aching and shooting Frequency: constant Radiation: L leg below the knee Aggravating factors: certain movements Alleviating factors: nothing Status: stable Treatments attempted: rest, ice, heat, APAP, ibuprofen, aleve, physical therapy and HEP  Relief with NSAIDs?: mild Nighttime pain:  yes Paresthesias / decreased sensation:  yes Bowel / bladder incontinence:  no Fevers:  no Dysuria / urinary frequency:  no    Relevant past medical, surgical, family and social history reviewed and updated as indicated. Interim medical history since our last visit reviewed. Allergies and medications reviewed and updated.  Review of Systems  Constitutional: Negative.   Respiratory: Negative.   Cardiovascular: Negative.   Genitourinary: Negative.   Musculoskeletal: Positive for back pain and myalgias. Negative for arthralgias, gait problem, joint swelling, neck pain and neck stiffness.  Skin: Negative.   Neurological: Positive for numbness. Negative for dizziness, tremors, seizures, syncope, facial asymmetry, speech difficulty,  weakness, light-headedness and headaches.  Psychiatric/Behavioral: Negative.     Per HPI unless specifically indicated above     Objective:    BP (!) 169/124 (BP Location: Left Arm, Patient Position: Sitting, Cuff Size: Normal)   Pulse (!) 101   Temp 98.6 F (37 C)   Ht 5' 0.2" (1.529 m)   Wt 191 lb 1 oz (86.7 kg)   SpO2 99%   BMI 37.07 kg/m   Wt Readings from Last 3 Encounters:  11/16/17 191 lb 1 oz (86.7 kg)  10/10/17 192 lb 9 oz (87.3 kg)  03/29/17 160 lb (72.6 kg)    Physical Exam  Constitutional: She is oriented to person, place, and time. She appears well-developed and well-nourished. No distress.  HENT:  Head: Normocephalic and atraumatic.  Right Ear: Hearing normal.  Left Ear: Hearing normal.  Nose: Nose normal.  Eyes: Conjunctivae and lids are normal. Right eye exhibits no discharge. Left eye exhibits no discharge. No scleral icterus.  Cardiovascular: Normal rate, regular rhythm, normal heart sounds and intact distal pulses. Exam reveals no gallop and no friction rub.  No murmur heard. Pulmonary/Chest: Effort normal and breath sounds normal. No stridor. No respiratory distress. She has no wheezes. She has no rales. She exhibits no tenderness.  Neurological: She is alert and oriented to person, place, and time.  Skin: Skin is warm, dry and intact. Capillary refill takes less than 2 seconds. No rash noted. She is not diaphoretic. No erythema. No pallor.  Psychiatric: She has a normal mood and affect. Her speech is normal and behavior  is normal. Judgment and thought content normal. Cognition and memory are normal.  Back Exam:    Inspection:  Normal spinal curvature.  No deformity, ecchymosis, erythema, or lesions     Palpation:     Midline spinal tenderness: no      Paralumbar tenderness: yes Left     Parathoracic tenderness: no      Buttocks tenderness: yesLeft     Range of Motion:      Flexion: Fingers to Knees     Extension:Decreased     Lateral  bending:Decreased    Rotation:Decreased    Neuro Exam:Lower extremity DTRs normal & symmetric.  Strength and sensation intact.    Special Tests:      Straight leg raise:positive    Results for orders placed or performed during the hospital encounter of 08/13/16  Culture, blood (Routine X 2) w Reflex to ID Panel  Result Value Ref Range   Specimen Description BLOOD LEFT WRIST    Special Requests BOTTLES DRAWN AEROBIC AND ANAEROBIC AER9ML ANA7ML    Culture NO GROWTH 5 DAYS    Report Status 08/18/2016 FINAL   Culture, blood (Routine X 2) w Reflex to ID Panel  Result Value Ref Range   Specimen Description BLOOD RIGHT ASSIST CONTROL    Special Requests BOTTLES DRAWN AEROBIC AND ANAEROBIC ANA6ML AER5ML    Culture  Setup Time      GRAM POSITIVE COCCI ANAEROBIC BOTTLE ONLY CRITICAL RESULT CALLED TO, READ BACK BY AND VERIFIED WITH: JASON ROBBINS ON 08/14/16 AT [address] [facility]    Culture (A)     STAPHYLOCOCCUS SPECIES (COAGULASE NEGATIVE) THE SIGNIFICANCE OF ISOLATING THIS ORGANISM FROM A SINGLE SET OF BLOOD CULTURES WHEN MULTIPLE SETS ARE DRAWN IS UNCERTAIN. PLEASE NOTIFY THE MICROBIOLOGY DEPARTMENT WITHIN ONE WEEK IF SPECIATION AND SENSITIVITIES ARE REQUIRED. Performed at [facility], [address], [place], [place] [address]    Report Status 08/17/2016 FINAL   Urine culture  Result Value Ref Range   Specimen Description URINE, RANDOM    Special Requests NONE    Culture MULTIPLE SPECIES PRESENT, SUGGEST RECOLLECTION (A)    Report Status 08/15/2016 FINAL   Blood Culture ID Panel (Reflexed)  Result Value Ref Range   Enterococcus species NOT DETECTED NOT DETECTED   Listeria monocytogenes NOT DETECTED NOT DETECTED   Staphylococcus species DETECTED (A) NOT DETECTED   Staphylococcus aureus NOT DETECTED NOT DETECTED   Methicillin resistance DETECTED (A) NOT DETECTED   Streptococcus species NOT DETECTED NOT DETECTED   Streptococcus agalactiae NOT DETECTED NOT DETECTED   Streptococcus  pneumoniae NOT DETECTED NOT DETECTED   Streptococcus pyogenes NOT DETECTED NOT DETECTED   Acinetobacter baumannii NOT DETECTED NOT DETECTED   Enterobacteriaceae species NOT DETECTED NOT DETECTED   Enterobacter cloacae complex NOT DETECTED NOT DETECTED   Escherichia coli NOT DETECTED NOT DETECTED   Klebsiella oxytoca NOT DETECTED NOT DETECTED   Klebsiella pneumoniae NOT DETECTED NOT DETECTED   Proteus species NOT DETECTED NOT DETECTED   Serratia marcescens NOT DETECTED NOT DETECTED   Haemophilus influenzae NOT DETECTED NOT DETECTED   Neisseria meningitidis NOT DETECTED NOT DETECTED   Pseudomonas aeruginosa NOT DETECTED NOT DETECTED   Candida albicans NOT DETECTED NOT DETECTED   Candida glabrata NOT DETECTED NOT DETECTED   Candida krusei NOT DETECTED NOT DETECTED   Candida parapsilosis NOT DETECTED NOT DETECTED   Candida tropicalis NOT DETECTED NOT DETECTED  CBC  Result Value Ref Range   WBC 8.2 3.6 - 11.0 K/uL  RBC 4.14 3.80 - 5.20 MIL/uL   Hemoglobin 14.1 12.0 - 16.0 g/dL   HCT 16.1 09.6 - 04.5 %   MCV 96.3 80.0 - 100.0 fL   MCH 34.0 26.0 - 34.0 pg   MCHC 35.3 32.0 - 36.0 g/dL   RDW 40.9 81.1 - 91.4 %   Platelets 267 150 - 440 K/uL  Lactic acid, plasma  Result Value Ref Range   Lactic Acid, Venous 4.6 (HH) 0.5 - 1.9 mmol/L  Comprehensive metabolic panel  Result Value Ref Range   Sodium 140 135 - 145 mmol/L   Potassium 3.2 (L) 3.5 - 5.1 mmol/L   Chloride 104 101 - 111 mmol/L   CO2 19 (L) 22 - 32 mmol/L   Glucose, Bld 142 (H) 65 - 99 mg/dL   BUN 16 6 - 20 mg/dL   Creatinine, Ser 7.82 0.44 - 1.00 mg/dL   Calcium 9.2 8.9 - 95.6 mg/dL   Total Protein 7.7 6.5 - 8.1 g/dL   Albumin 4.6 3.5 - 5.0 g/dL   AST 25 15 - 41 U/L   ALT 18 14 - 54 U/L   Alkaline Phosphatase 59 38 - 126 U/L   Total Bilirubin 0.5 0.3 - 1.2 mg/dL   GFR calc non Af Amer >60 >60 mL/min   GFR calc Af Amer >60 >60 mL/min   Anion gap 17 (H) 5 - 15  Lipase, blood  Result Value Ref Range   Lipase 64 (H) 11  - 51 U/L  Troponin I  Result Value Ref Range   Troponin I <0.03 <0.03 ng/mL  hCG, quantitative, pregnancy  Result Value Ref Range   hCG, Beta Chain, Quant, S <1 <5 mIU/mL  Urinalysis, Complete w Microscopic  Result Value Ref Range   Color, Urine YELLOW (A) YELLOW   APPearance HAZY (A) CLEAR   Specific Gravity, Urine 1.012 1.005 - 1.030   pH 6.0 5.0 - 8.0   Glucose, UA 50 (A) NEGATIVE mg/dL   Hgb urine dipstick SMALL (A) NEGATIVE   Bilirubin Urine NEGATIVE NEGATIVE   Ketones, ur 5 (A) NEGATIVE mg/dL   Protein, ur 213 (A) NEGATIVE mg/dL   Nitrite NEGATIVE NEGATIVE   Leukocytes, UA TRACE (A) NEGATIVE   RBC / HPF 0-5 0 - 5 RBC/hpf   WBC, UA 6-30 0 - 5 WBC/hpf   Bacteria, UA FEW (A) NONE SEEN   Squamous Epithelial / LPF 0-5 (A) NONE SEEN   Mucus PRESENT   Influenza panel by PCR (type A & B)  Result Value Ref Range   Influenza A By PCR NEGATIVE NEGATIVE   Influenza B By PCR NEGATIVE NEGATIVE  Ethanol  Result Value Ref Range   Alcohol, Ethyl (B) 212 (H) <5 mg/dL  Acetaminophen level  Result Value Ref Range   Acetaminophen (Tylenol), Serum <10 (L) 10 - 30 ug/mL  Salicylate level  Result Value Ref Range   Salicylate Lvl <7.0 2.8 - 30.0 mg/dL  Urine Drug Screen, Qualitative ([facility] only)  Result Value Ref Range   Tricyclic, Ur Screen NONE DETECTED NONE DETECTED   Amphetamines, Ur Screen NONE DETECTED NONE DETECTED   MDMA (Ecstasy)Ur Screen NONE DETECTED NONE DETECTED   Cocaine Metabolite,Ur [place] NONE DETECTED NONE DETECTED   Opiate, Ur Screen NONE DETECTED NONE DETECTED   Phencyclidine (PCP) Ur S NONE DETECTED NONE DETECTED   Cannabinoid 50 Ng, Ur [place] NONE DETECTED NONE DETECTED   Barbiturates, Ur Screen NONE DETECTED NONE DETECTED   Benzodiazepine, Ur Scrn NONE DETECTED NONE DETECTED  Methadone Scn, Ur NONE DETECTED NONE DETECTED  Glucose, capillary  Result Value Ref Range   Glucose-Capillary 139 (H) 65 - 99 mg/dL  Lactic acid, plasma  Result Value Ref Range   Lactic Acid,  Venous 4.1 (HH) 0.5 - 1.9 mmol/L  Magnesium  Result Value Ref Range   Magnesium 2.1 1.7 - 2.4 mg/dL  Lactic acid, plasma  Result Value Ref Range   Lactic Acid, Venous 2.5 (HH) 0.5 - 1.9 mmol/L  Lactic acid, plasma  Result Value Ref Range   Lactic Acid, Venous 2.4 (HH) 0.5 - 1.9 mmol/L  Basic metabolic panel  Result Value Ref Range   Sodium 138 135 - 145 mmol/L   Potassium 3.9 3.5 - 5.1 mmol/L   Chloride 112 (H) 101 - 111 mmol/L   CO2 19 (L) 22 - 32 mmol/L   Glucose, Bld 85 65 - 99 mg/dL   BUN 15 6 - 20 mg/dL   Creatinine, Ser 1.61 0.44 - 1.00 mg/dL   Calcium 8.0 (L) 8.9 - 10.3 mg/dL   GFR calc non Af Amer >60 >60 mL/min   GFR calc Af Amer >60 >60 mL/min   Anion gap 7 5 - 15  CBC  Result Value Ref Range   WBC 10.7 3.6 - 11.0 K/uL   RBC 3.31 (L) 3.80 - 5.20 MIL/uL   Hemoglobin 11.2 (L) 12.0 - 16.0 g/dL   HCT 09.6 (L) 04.5 - 40.9 %   MCV 97.0 80.0 - 100.0 fL   MCH 33.7 26.0 - 34.0 pg   MCHC 34.8 32.0 - 36.0 g/dL   RDW 81.1 91.4 - 78.2 %   Platelets 219 150 - 440 K/uL  Lactic acid, plasma  Result Value Ref Range   Lactic Acid, Venous 2.5 (HH) 0.5 - 1.9 mmol/L  Hemoglobin  Result Value Ref Range   Hemoglobin 11.7 (L) 12.0 - 16.0 g/dL      Assessment & Plan:   Problem List Items Addressed This Visit    None    Visit Diagnoses    Chronic left-sided low back pain with left-sided sciatica    -  Primary   No better, but only 2 PTs so far. Will give some pain medicine for really bad days, Toradol shot today. Continue PT, if not better in 2 weeks MRI.   Relevant Medications   HYDROcodone-acetaminophen (NORCO/VICODIN) 5-325 MG tablet   ketorolac (TORADOL) injection 60 mg (Start on 11/16/2017  8:30 AM)       Follow up plan: Return in about 2 weeks (around 11/30/2017) for Physical and back pain.

## 2017-11-22 ENCOUNTER — Telehealth: Payer: Self-pay | Admitting: Family Medicine

## 2017-11-22 NOTE — Telephone Encounter (Signed)
Copied from CRM [id number]. Topic: Quick Communication - See Telephone Encounter >> Nov 22, 2017 11:11 AM Rudi Coco, NT wrote: CRM for notification. See Telephone encounter for: 11/22/17.  Pt. Stated that she received a call from office but no vm can be left and no notes put in chart

## 2017-12-06 ENCOUNTER — Encounter: Admitting: Family Medicine

## 2018-01-02 ENCOUNTER — Other Ambulatory Visit (HOSPITAL_COMMUNITY)
Admission: RE | Admit: 2018-01-02 | Discharge: 2018-01-02 | Disposition: A | Discharge status: Home / Self Care | Source: Ambulatory Visit | Attending: Family Medicine | Admitting: Family Medicine

## 2018-01-02 ENCOUNTER — Ambulatory Visit (INDEPENDENT_AMBULATORY_CARE_PROVIDER_SITE_OTHER): Admitting: Family Medicine

## 2018-01-02 ENCOUNTER — Encounter: Payer: Self-pay | Admitting: Family Medicine

## 2018-01-02 VITALS — BP 176/126 | HR 87 | Ht 61.0 in | Wt 194.9 lb

## 2018-01-02 DIAGNOSIS — L309 Dermatitis, unspecified: Secondary | ICD-10-CM | POA: Insufficient documentation

## 2018-01-02 DIAGNOSIS — Z23 Encounter for immunization: Secondary | ICD-10-CM | POA: Diagnosis not present

## 2018-01-02 DIAGNOSIS — R030 Elevated blood-pressure reading, without diagnosis of hypertension: Secondary | ICD-10-CM

## 2018-01-02 DIAGNOSIS — I10 Essential (primary) hypertension: Secondary | ICD-10-CM | POA: Diagnosis not present

## 2018-01-02 DIAGNOSIS — Z0001 Encounter for general adult medical examination with abnormal findings: Secondary | ICD-10-CM | POA: Diagnosis not present

## 2018-01-02 DIAGNOSIS — Z124 Encounter for screening for malignant neoplasm of cervix: Secondary | ICD-10-CM | POA: Diagnosis not present

## 2018-01-02 DIAGNOSIS — M5442 Lumbago with sciatica, left side: Secondary | ICD-10-CM | POA: Diagnosis not present

## 2018-01-02 DIAGNOSIS — N390 Urinary tract infection, site not specified: Secondary | ICD-10-CM | POA: Diagnosis not present

## 2018-01-02 DIAGNOSIS — G8929 Other chronic pain: Secondary | ICD-10-CM

## 2018-01-02 DIAGNOSIS — M5136 Other intervertebral disc degeneration, lumbar region: Secondary | ICD-10-CM

## 2018-01-02 DIAGNOSIS — R8281 Pyuria: Secondary | ICD-10-CM

## 2018-01-02 DIAGNOSIS — Z0000 Encounter for general adult medical examination without abnormal findings: Secondary | ICD-10-CM

## 2018-01-02 LAB — UA/M W/RFLX CULTURE, ROUTINE
Bilirubin, UA: NEGATIVE
Glucose, UA: NEGATIVE
KETONES UA: NEGATIVE
Leukocytes, UA: NEGATIVE
Nitrite, UA: NEGATIVE
Protein, UA: NEGATIVE
SPEC GRAV UA: 1.02 (ref 1.005–1.030)
Urobilinogen, Ur: 0.2 mg/dL (ref 0.2–1.0)
pH, UA: 5.5 (ref 5.0–7.5)

## 2018-01-02 LAB — MICROALBUMIN, URINE WAIVED
Creatinine, Urine Waived: 200 mg/dL (ref 10–300)
MICROALB, UR WAIVED: 30 mg/L — AB (ref 0–19)

## 2018-01-02 LAB — MICROSCOPIC EXAMINATION

## 2018-01-02 MED ORDER — TRIAMCINOLONE ACETONIDE 0.5 % EX OINT: 1.0000 "application " | TOPICAL_OINTMENT | Freq: Two times a day (BID) | CUTANEOUS | 3 refills | Status: DC

## 2018-01-02 MED ORDER — LISINOPRIL 10 MG PO TABS: 10.0000 mg | ORAL_TABLET | Freq: Every day | ORAL | 3 refills | Status: DC

## 2018-01-02 NOTE — Assessment & Plan Note (Signed)
Will treat with triamcinalone. Call with any concerns. Continue to monitor. Call with any concerns.

## 2018-01-02 NOTE — Assessment & Plan Note (Signed)
Will start 10mg  lisinopril and recheck 1 month. Call with any concerns.

## 2018-01-02 NOTE — Patient Instructions (Addendum)
Health Maintenance, Female Adopting a healthy lifestyle and getting preventive care can go a long way to promote health and wellness. Talk with your health care provider about what schedule of regular examinations is right for you. This is a good chance for you to check in with your provider about disease prevention and staying healthy. In between checkups, there are plenty of things you can do on your own. Experts have done a lot of research about which lifestyle changes and preventive measures are most likely to keep you healthy. Ask your health care provider for more information. Weight and diet Eat a healthy diet  Be sure to include plenty of vegetables, fruits, low-fat dairy products, and lean protein.  Do not eat a lot of foods high in solid fats, added sugars, or salt.  Get regular exercise. This is one of the most important things you can do for your health. ? Most adults should exercise for at least 150 minutes each week. The exercise should increase your heart rate and make you sweat (moderate-intensity exercise). ? Most adults should also do strengthening exercises at least twice a week. This is in addition to the moderate-intensity exercise.  Maintain a healthy weight  Body mass index (BMI) is a measurement that can be used to identify possible weight problems. It estimates body fat based on height and weight. Your health care provider can help determine your BMI and help you achieve or maintain a healthy weight.  For females 20 years of age and older: ? A BMI below 18.5 is considered underweight. ? A BMI of 18.5 to 24.9 is normal. ? A BMI of 25 to 29.9 is considered overweight. ? A BMI of 30 and above is considered obese.  Watch levels of cholesterol and blood lipids  You should start having your blood tested for lipids and cholesterol at 38 years of age, then have this test every 5 years.  You may need to have your cholesterol levels checked more often if: ? Your lipid or  cholesterol levels are high. ? You are older than 38 years of age. ? You are at high risk for heart disease.  Cancer screening Lung Cancer  Lung cancer screening is recommended for adults 55-80 years old who are at high risk for lung cancer because of a history of smoking.  A yearly low-dose CT scan of the lungs is recommended for people who: ? Currently smoke. ? Have quit within the past 15 years. ? Have at least a 30-pack-year history of smoking. A pack year is smoking an average of one pack of cigarettes a day for 1 year.  Yearly screening should continue until it has been 15 years since you quit.  Yearly screening should stop if you develop a health problem that would prevent you from having lung cancer treatment.  Breast Cancer  Practice breast self-awareness. This means understanding how your breasts normally appear and feel.  It also means doing regular breast self-exams. Let your health care provider know about any changes, no matter how small.  If you are in your 20s or 30s, you should have a clinical breast exam (CBE) by a health care provider every 1-3 years as part of a regular health exam.  If you are 40 or older, have a CBE every year. Also consider having a breast X-ray (mammogram) every year.  If you have a family history of breast cancer, talk to your health care provider about genetic screening.  If you are at high risk   for breast cancer, talk to your health care provider about having an MRI and a mammogram every year.  Breast cancer gene (BRCA) assessment is recommended for women who have family members with BRCA-related cancers. BRCA-related cancers include: ? Breast. ? Ovarian. ? Tubal. ? Peritoneal cancers.  Results of the assessment will determine the need for genetic counseling and BRCA1 and BRCA2 testing.  Cervical Cancer Your health care provider may recommend that you be screened regularly for cancer of the pelvic organs (ovaries, uterus, and  vagina). This screening involves a pelvic examination, including checking for microscopic changes to the surface of your cervix (Pap test). You may be encouraged to have this screening done every 3 years, beginning at age 22.  For women ages 56-65, health care providers may recommend pelvic exams and Pap testing every 3 years, or they may recommend the Pap and pelvic exam, combined with testing for human papilloma virus (HPV), every 5 years. Some types of HPV increase your risk of cervical cancer. Testing for HPV may also be done on women of any age with unclear Pap test results.  Other health care providers may not recommend any screening for nonpregnant women who are considered low risk for pelvic cancer and who do not have symptoms. Ask your health care provider if a screening pelvic exam is right for you.  If you have had past treatment for cervical cancer or a condition that could lead to cancer, you need Pap tests and screening for cancer for at least 20 years after your treatment. If Pap tests have been discontinued, your risk factors (such as having a new sexual partner) need to be reassessed to determine if screening should resume. Some women have medical problems that increase the chance of getting cervical cancer. In these cases, your health care provider may recommend more frequent screening and Pap tests.  Colorectal Cancer  This type of cancer can be detected and often prevented.  Routine colorectal cancer screening usually begins at 38 years of age and continues through 38 years of age.  Your health care provider may recommend screening at an earlier age if you have risk factors for colon cancer.  Your health care provider may also recommend using home test kits to check for hidden blood in the stool.  A small camera at the end of a tube can be used to examine your colon directly (sigmoidoscopy or colonoscopy). This is done to check for the earliest forms of colorectal  cancer.  Routine screening usually begins at age 33.  Direct examination of the colon should be repeated every 5-10 years through 38 years of age. However, you may need to be screened more often if early forms of precancerous polyps or small growths are found.  Skin Cancer  Check your skin from head to toe regularly.  Tell your health care provider about any new moles or changes in moles, especially if there is a change in a mole's shape or color.  Also tell your health care provider if you have a mole that is larger than the size of a pencil eraser.  Always use sunscreen. Apply sunscreen liberally and repeatedly throughout the day.  Protect yourself by wearing long sleeves, pants, a wide-brimmed hat, and sunglasses whenever you are outside.  Heart disease, diabetes, and high blood pressure  High blood pressure causes heart disease and increases the risk of stroke. High blood pressure is more likely to develop in: ? People who have blood pressure in the high end of  the normal range (130-139/85-89 mm Hg). ? People who are overweight or obese. ? People who are African American.  If you are 21-29 years of age, have your blood pressure checked every 3-5 years. If you are 3 years of age or older, have your blood pressure checked every year. You should have your blood pressure measured twice-once when you are at a hospital or clinic, and once when you are not at a hospital or clinic. Record the average of the two measurements. To check your blood pressure when you are not at a hospital or clinic, you can use: ? An automated blood pressure machine at a pharmacy. ? A home blood pressure monitor.  If you are between 17 years and 37 years old, ask your health care provider if you should take aspirin to prevent strokes.  Have regular diabetes screenings. This involves taking a blood sample to check your fasting blood sugar level. ? If you are at a normal weight and have a low risk for diabetes,  have this test once every three years after 38 years of age. ? If you are overweight and have a high risk for diabetes, consider being tested at a younger age or more often. Preventing infection Hepatitis B  If you have a higher risk for hepatitis B, you should be screened for this virus. You are considered at high risk for hepatitis B if: ? You were born in a country where hepatitis B is common. Ask your health care provider which countries are considered high risk. ? Your parents were born in a high-risk country, and you have not been immunized against hepatitis B (hepatitis B vaccine). ? You have HIV or AIDS. ? You use needles to inject street drugs. ? You live with someone who has hepatitis B. ? You have had sex with someone who has hepatitis B. ? You get hemodialysis treatment. ? You take certain medicines for conditions, including cancer, organ transplantation, and autoimmune conditions.  Hepatitis C  Blood testing is recommended for: ? Everyone born from [birth year] through 1966. ? Anyone with known risk factors for hepatitis C.  Sexually transmitted infections (STIs)  You should be screened for sexually transmitted infections (STIs) including gonorrhea and chlamydia if: ? You are sexually active and are younger than 38 years of age. ? You are older than 38 years of age and your health care provider tells you that you are at risk for this type of infection. ? Your sexual activity has changed since you were last screened and you are at an increased risk for chlamydia or gonorrhea. Ask your health care provider if you are at risk.  If you do not have HIV, but are at risk, it may be recommended that you take a prescription medicine daily to prevent HIV infection. This is called pre-exposure prophylaxis (PrEP). You are considered at risk if: ? You are sexually active and do not regularly use condoms or know the HIV status of your partner(s). ? You take drugs by injection. ? You are  sexually active with a partner who has HIV.  Talk with your health care provider about whether you are at high risk of being infected with HIV. If you choose to begin PrEP, you should first be tested for HIV. You should then be tested every 3 months for as long as you are taking PrEP. Pregnancy  If you are premenopausal and you may become pregnant, ask your health care provider about preconception counseling.  If you may become  pregnant, take 400 to 800 micrograms (mcg) of folic acid every day.  If you want to prevent pregnancy, talk to your health care provider about birth control (contraception). Osteoporosis and menopause  Osteoporosis is a disease in which the bones lose minerals and strength with aging. This can result in serious bone fractures. Your risk for osteoporosis can be identified using a bone density scan.  If you are 65 years of age or older, or if you are at risk for osteoporosis and fractures, ask your health care provider if you should be screened.  Ask your health care provider whether you should take a calcium or vitamin D supplement to lower your risk for osteoporosis.  Menopause may have certain physical symptoms and risks.  Hormone replacement therapy may reduce some of these symptoms and risks. Talk to your health care provider about whether hormone replacement therapy is right for you. Follow these instructions at home:  Schedule regular health, dental, and eye exams.  Stay current with your immunizations.  Do not use any tobacco products including cigarettes, chewing tobacco, or electronic cigarettes.  If you are pregnant, do not drink alcohol.  If you are breastfeeding, limit how much and how often you drink alcohol.  Limit alcohol intake to no more than 1 drink per day for nonpregnant women. One drink equals 12 ounces of beer, 5 ounces of wine, or 1 ounces of hard liquor.  Do not use street drugs.  Do not share needles.  Ask your health care  provider for help if you need support or information about quitting drugs.  Tell your health care provider if you often feel depressed.  Tell your health care provider if you have ever been abused or do not feel safe at home. This information is not intended to replace advice given to you by your health care provider. Make sure you discuss any questions you have with your health care provider. Document Released: 01/04/2011 Document Revised: 11/27/2015 Document Reviewed: 03/25/2015 Elsevier Interactive Patient Education  2018 [employer]  DASH Eating Plan DASH stands for "Dietary Approaches to Stop Hypertension." The DASH eating plan is a healthy eating plan that has been shown to reduce high blood pressure (hypertension). It may also reduce your risk for type 2 diabetes, heart disease, and stroke. The DASH eating plan may also help with weight loss. What are tips for following this plan? General guidelines  Avoid eating more than 2,300 mg (milligrams) of salt (sodium) a day. If you have hypertension, you may need to reduce your sodium intake to 1,500 mg a day.  Limit alcohol intake to no more than 1 drink a day for nonpregnant women and 2 drinks a day for men. One drink equals 12 oz of beer, 5 oz of wine, or 1 oz of hard liquor.  Work with your health care provider to maintain a healthy body weight or to lose weight. Ask what an ideal weight is for you.  Get at least 30 minutes of exercise that causes your heart to beat faster (aerobic exercise) most days of the week. Activities may include walking, swimming, or biking.  Work with your health care provider or diet and nutrition specialist (dietitian) to adjust your eating plan to your individual calorie needs. Reading food labels  Check food labels for the amount of sodium per serving. Choose foods with less than 5 percent of the Daily Value of sodium. Generally, foods with less than 300 mg of sodium per serving fit into this eating    plan.  To find whole grains, look for the word "whole" as the first word in the ingredient list. Shopping  Buy products labeled as "low-sodium" or "no salt added."  Buy fresh foods. Avoid canned foods and premade or frozen meals. Cooking  Avoid adding salt when cooking. Use salt-free seasonings or herbs instead of table salt or sea salt. Check with your health care provider or pharmacist before using salt substitutes.  Do not fry foods. Cook foods using healthy methods such as baking, boiling, grilling, and broiling instead.  Cook with heart-healthy oils, such as olive, canola, soybean, or sunflower oil. Meal planning   Eat a balanced diet that includes: ? 5 or more servings of fruits and vegetables each day. At each meal, try to fill half of your plate with fruits and vegetables. ? Up to 6-8 servings of whole grains each day. ? Less than 6 oz of lean meat, poultry, or fish each day. A 3-oz serving of meat is about the same size as a deck of cards. One egg equals 1 oz. ? 2 servings of low-fat dairy each day. ? A serving of nuts, seeds, or beans 5 times each week. ? Heart-healthy fats. Healthy fats called Omega-3 fatty acids are found in foods such as flaxseeds and coldwater fish, like sardines, salmon, and mackerel.  Limit how much you eat of the following: ? Canned or prepackaged foods. ? Food that is high in trans fat, such as fried foods. ? Food that is high in saturated fat, such as fatty meat. ? Sweets, desserts, sugary drinks, and other foods with added sugar. ? Full-fat dairy products.  Do not salt foods before eating.  Try to eat at least 2 vegetarian meals each week.  Eat more home-cooked food and less restaurant, buffet, and fast food.  When eating at a restaurant, ask that your food be prepared with less salt or no salt, if possible. What foods are recommended? The items listed may not be a complete list. Talk with your dietitian about what dietary choices are best  for you. Grains Whole-grain or whole-wheat bread. Whole-grain or whole-wheat pasta. Brown rice. Oatmeal. Quinoa. Bulgur. Whole-grain and low-sodium cereals. Pita bread. Low-fat, low-sodium crackers. Whole-wheat flour tortillas. Vegetables Fresh or frozen vegetables (raw, steamed, roasted, or grilled). Low-sodium or reduced-sodium tomato and vegetable juice. Low-sodium or reduced-sodium tomato sauce and tomato paste. Low-sodium or reduced-sodium canned vegetables. Fruits All fresh, dried, or frozen fruit. Canned fruit in natural juice (without added sugar). Meat and other protein foods Skinless chicken or turkey. Ground chicken or turkey. Pork with fat trimmed off. Fish and seafood. Egg whites. Dried beans, peas, or lentils. Unsalted nuts, nut butters, and seeds. Unsalted canned beans. Lean cuts of beef with fat trimmed off. Low-sodium, lean deli meat. Dairy Low-fat (1%) or fat-free (skim) milk. Fat-free, low-fat, or reduced-fat cheeses. Nonfat, low-sodium ricotta or cottage cheese. Low-fat or nonfat yogurt. Low-fat, low-sodium cheese. Fats and oils Soft margarine without trans fats. Vegetable oil. Low-fat, reduced-fat, or light mayonnaise and salad dressings (reduced-sodium). Canola, safflower, olive, soybean, and sunflower oils. Avocado. Seasoning and other foods Herbs. Spices. Seasoning mixes without salt. Unsalted popcorn and pretzels. Fat-free sweets. What foods are not recommended? The items listed may not be a complete list. Talk with your dietitian about what dietary choices are best for you. Grains Baked goods made with fat, such as croissants, muffins, or some breads. Dry pasta or rice meal packs. Vegetables Creamed or fried vegetables. Vegetables in a cheese sauce. Regular canned   vegetables (not low-sodium or reduced-sodium). Regular canned tomato sauce and paste (not low-sodium or reduced-sodium). Regular tomato and vegetable juice (not low-sodium or reduced-sodium). Angie Fava.  Olives. Fruits Canned fruit in a light or heavy syrup. Fried fruit. Fruit in cream or butter sauce. Meat and other protein foods Fatty cuts of meat. Ribs. Fried meat. Berniece Salines. Sausage. Bologna and other processed lunch meats. Salami. Fatback. Hotdogs. Bratwurst. Salted nuts and seeds. Canned beans with added salt. Canned or smoked fish. Whole eggs or egg yolks. Chicken or [place] with skin. Dairy Whole or 2% milk, cream, and half-and-half. Whole or full-fat cream cheese. Whole-fat or sweetened yogurt. Full-fat cheese. Nondairy creamers. Whipped toppings. Processed cheese and cheese spreads. Fats and oils Butter. Stick margarine. Lard. Shortening. Ghee. Bacon fat. Tropical oils, such as coconut, palm kernel, or palm oil. Seasoning and other foods Salted popcorn and pretzels. Onion salt, garlic salt, seasoned salt, table salt, and sea salt. Worcestershire sauce. Tartar sauce. Barbecue sauce. Teriyaki sauce. Soy sauce, including reduced-sodium. Steak sauce. Canned and packaged gravies. Fish sauce. Oyster sauce. Cocktail sauce. Horseradish that you find on the shelf. Ketchup. Mustard. Meat flavorings and tenderizers. Bouillon cubes. Hot sauce and Tabasco sauce. Premade or packaged marinades. Premade or packaged taco seasonings. Relishes. Regular salad dressings. Where to find more information:  National Heart, Lung, and [place]: https://wilson-eaton.com/  American Heart Association: www.heart.org Summary  The DASH eating plan is a healthy eating plan that has been shown to reduce high blood pressure (hypertension). It may also reduce your risk for type 2 diabetes, heart disease, and stroke.  With the DASH eating plan, you should limit salt (sodium) intake to 2,300 mg a day. If you have hypertension, you may need to reduce your sodium intake to 1,500 mg a day.  When on the DASH eating plan, aim to eat more fresh fruits and vegetables, whole grains, lean proteins, low-fat dairy, and heart-healthy  fats.  Work with your health care provider or diet and nutrition specialist (dietitian) to adjust your eating plan to your individual calorie needs. This information is not intended to replace advice given to you by your health care provider. Make sure you discuss any questions you have with your health care provider. Document Released: 06/10/2011 Document Revised: 06/14/2016 Document Reviewed: 06/14/2016 Elsevier Interactive Patient Education  2018 [employer]. HPV (Human Papillomavirus) Vaccine: What You Need to Know 1. Why get vaccinated? HPV vaccine prevents infection with human papillomavirus (HPV) types that are associated with many cancers, including:  cervical cancer in females,  vaginal and vulvar cancers in females,  anal cancer in females and males,  throat cancer in females and males, and  penile cancer in males.  In addition, HPV vaccine prevents infection with HPV types that cause genital warts in both females and males. In the [place] about 12,000 women get cervical cancer every year, and about 4,000 women die from it. HPV vaccine can prevent most of these cases of cervical cancer. Vaccination is not a substitute for cervical cancer screening. This vaccine does not protect against all HPV types that can cause cervical cancer. Women should still get regular Pap tests. HPV infection usually comes from sexual contact, and most people will become infected at some point in their life. About 14 million Americans, including teens, get infected every year. Most infections will go away on their own and not cause serious problems. But thousands of women and men get cancer and other diseases from HPV. 2. HPV vaccine HPV vaccine is approved by FDA  and is recommended by CDC for both males and females. It is routinely given at 51 or 38 years of age, but it may be given beginning at age 74 years through age 26 years. Most adolescents 9 through 38 years of age should get HPV vaccine as a  two-dose series with the doses separated by 6-12 months. People who start HPV vaccination at 40 years of age and older should get the vaccine as a three-dose series with the second dose given 1-2 months after the first dose and the third dose given 6 months after the first dose. There are several exceptions to these age recommendations. Your health care provider can give you more information. 3. Some people should not get this vaccine  Anyone who has had a severe (life-threatening) allergic reaction to a dose of HPV vaccine should not get another dose.  Anyone who has a severe (life threatening) allergy to any component of HPV vaccine should not get the vaccine.  Tell your doctor if you have any severe allergies that you know of, including a severe allergy to yeast.  HPV vaccine is not recommended for pregnant women. If you learn that you were pregnant when you were vaccinated, there is no reason to expect any problems for you or your baby. Any woman who learns she was pregnant when she got HPV vaccine is encouraged to contact the manufacturer's registry for HPV vaccination during pregnancy at [phone]. Women who are breastfeeding may be vaccinated.  If you have a mild illness, such as a cold, you can probably get the vaccine today. If you are moderately or severely ill, you should probably wait until you recover. Your doctor can advise you. 4. Risks of a vaccine reaction With any medicine, including vaccines, there is a chance of side effects. These are usually mild and go away on their own, but serious reactions are also possible. Most people who get HPV vaccine do not have any serious problems with it. Mild or moderate problems following HPV vaccine:  Reactions in the arm where the shot was given: ? Soreness (about 9 people in 10) ? Redness or swelling (about 1 person in 3)  Fever: ? Mild (100F) (about 1 person in 10) ? Moderate (102F) (about 1 person in 40)  Other  problems: ? Headache (about 1 person in 3) Problems that could happen after any injected vaccine:  People sometimes faint after a medical procedure, including vaccination. Sitting or lying down for about 15 minutes can help prevent fainting, and injuries caused by a fall. Tell your doctor if you feel dizzy, or have vision changes or ringing in the ears.  Some people get severe pain in the shoulder and have difficulty moving the arm where a shot was given. This happens very rarely.  Any medication can cause a severe allergic reaction. Such reactions from a vaccine are very rare, estimated at about 1 in a million doses, and would happen within a few minutes to a few hours after the vaccination. As with any medicine, there is a very remote chance of a vaccine causing a serious injury or death. The safety of vaccines is always being monitored. For more information, visit: http://www.aguilar.org/. 5. What if there is a serious reaction? What should I look for? Look for anything that concerns you, such as signs of a severe allergic reaction, very high fever, or unusual behavior. Signs of a severe allergic reaction can include hives, swelling of the face and throat, difficulty breathing, a fast heartbeat,  dizziness, and weakness. These would usually start a few minutes to a few hours after the vaccination. What should I do? If you think it is a severe allergic reaction or other emergency that can't wait, call 9-1-1 or get to the nearest hospital. Otherwise, call your doctor. Afterward, the reaction should be reported to the Vaccine Adverse Event Reporting System (VAERS). Your doctor should file this report, or you can do it yourself through the VAERS web site at www.vaers.SamedayNews.es, or by calling (587)384-5635. VAERS does not give medical advice. 6. The National Vaccine Injury Compensation Program The [employer] Vaccine Injury Compensation Program (VICP) is a federal program that was created to  compensate people who may have been injured by certain vaccines. Persons who believe they may have been injured by a vaccine can learn about the program and about filing a claim by calling 657-502-4832 or visiting the [place] website at GoldCloset.com.ee. There is a time limit to file a claim for compensation. 7. How can I learn more?  Ask your health care provider. He or she can give you the vaccine package insert or suggest other sources of information.  Call your local or state health department.  Contact the Centers for Disease Control and Prevention (CDC): ? Call 508-222-0248 (1-800-CDC-INFO) or ? Visit CDC's website at http://sweeney-todd.com/ Vaccine Information Statement, HPV Vaccine (06/06/2015) This information is not intended to replace advice given to you by your health care provider. Make sure you discuss any questions you have with your health care provider. Document Released: 01/16/2014 Document Revised: 03/11/2016 Document Reviewed: 03/11/2016 Elsevier Interactive Patient Education  2017 [employer].

## 2018-01-02 NOTE — Progress Notes (Signed)
BP (!) 176/126   Pulse 87   Ht 5\' 1"  (1.549 m)   Wt 194 lb 14.4 oz (88.4 kg)   SpO2 98%   BMI 36.83 kg/m    Subjective:    Patient ID: Cynthia Barnes, female    DOB: 03/29/1981, 37 y.o.   MRN: [id number]  HPI: Cynthia Barnes is a 37 y.o. female presenting on 01/02/2018 for comprehensive medical examination. Current medical complaints include:  Back pain is significantly better. She notes that the throbbing is gone. Still having some pain in the front of her thigh and into her low back. Notes that PT seemed to make it worse rather than getting better. She notes that her pain is about a 5/10.  ELEVATED BLOOD PRESSURE Duration of elevated BP: chronic BP monitoring frequency: not checking BP range: 160s-170s/100s Previous BP meds: no Recent stressors: yes Family history of hypertension: yes Recurrent headaches: no Visual changes: no Palpitations: no  Dyspnea: no Chest pain: no Lower extremity edema: no Dizzy/lightheaded: no Transient ischemic attacks: no  She currently lives with: husband, sons, sister and niece and nephew Menopausal Symptoms: no  Depression Screen done today and results listed below:  Depression screen [facility] 2/9 01/02/2018 10/10/2017  Decreased Interest 0 0  Down, Depressed, Hopeless 0 0  PHQ - 2 Score 0 0  Altered sleeping 2 -  Tired, decreased energy 1 -  Change in appetite 1 -  Feeling bad or failure about yourself  0 -  Trouble concentrating 0 -  Moving slowly or fidgety/restless 0 -  Suicidal thoughts 0 -  PHQ-9 Score 4 -  Difficult doing work/chores Not difficult at all -    Past Medical History:  Past Medical History:  Diagnosis Date  . Hypotension 08/13/2016  . Meningitis     Surgical History:  Past Surgical History:  Procedure Laterality Date  . APPENDECTOMY    . CESAREAN SECTION    . KNEE SURGERY Left   . SHOULDER SURGERY Right   . TONSILLECTOMY      Medications:  Current Outpatient Medications on File Prior to Visit    Medication Sig  . hydrOXYzine (ATARAX/VISTARIL) 25 MG tablet Take 0.5-1 tablets (12.5-25 mg total) by mouth 3 (three) times daily as needed.  Marland Kitchen ibuprofen (ADVIL,MOTRIN) 200 MG tablet Take 200 mg by mouth every 6 (six) hours as needed.  Marland Kitchen levonorgestrel (MIRENA, 52 MG,) 20 MCG/24HR IUD by Intrauterine route.  . Multiple Vitamin (MULTIVITAMIN) tablet Take 1 tablet by mouth daily.  . naproxen (NAPROSYN) 500 MG tablet Take 1 tablet (500 mg total) by mouth 2 (two) times daily with a meal.   No current facility-administered medications on file prior to visit.     Allergies:  Allergies  Allergen Reactions  . Codeine Other (See Comments)    Pt states her "eyes fixated upwards"    Social History:  Social History   Socioeconomic History  . Marital status: Married    Spouse name: Not on file  . Number of children: Not on file  . Years of education: Not on file  . Highest education level: Not on file  Occupational History  . Not on file  Social Needs  . Financial resource strain: Not on file  . Food insecurity:    Worry: Not on file    Inability: Not on file  . Transportation needs:    Medical: Not on file    Non-medical: Not on file  Tobacco Use  . Smoking status: Current Every Day  Smoker    Packs/day: 0.50    Types: Cigarettes  . Smokeless tobacco: Never Used  Substance and Sexual Activity  . Alcohol use: Yes    Comment: Pt states drinking "ocassionally"  . Drug use: No  . Sexual activity: Yes    Birth control/protection: IUD  Lifestyle  . Physical activity:    Days per week: Not on file    Minutes per session: Not on file  . Stress: Not on file  Relationships  . Social connections:    Talks on phone: Not on file    Gets together: Not on file    Attends religious service: Not on file    Active member of club or organization: Not on file    Attends meetings of clubs or organizations: Not on file    Relationship status: Not on file  . Intimate partner violence:     Fear of current or ex partner: Not on file    Emotionally abused: Not on file    Physically abused: Not on file    Forced sexual activity: Not on file  Other Topics Concern  . Not on file  Social History Narrative  . Not on file   Social History   Tobacco Use  Smoking Status Current Every Day Smoker  . Packs/day: 0.50  . Types: Cigarettes  Smokeless Tobacco Never Used   Social History   Substance and Sexual Activity  Alcohol Use Yes   Comment: Pt states drinking "ocassionally"    Family History:  Family History  Problem Relation Age of Onset  . Hyperlipidemia Mother   . Thyroid disease Mother   . Hyperlipidemia Father   . Hypertension Father   . Aneurysm Father        Brain  . Hyperlipidemia Sister   . Hypertension Sister   . Hypertension Brother   . Heart attack Brother     Past medical history, surgical history, medications, allergies, family history and social history reviewed with patient today and changes made to appropriate areas of the chart.   Review of Systems  Constitutional: Negative.   HENT: Negative.   Eyes: Negative.   Respiratory: Negative.   Cardiovascular: Negative.   Gastrointestinal: Negative.   Genitourinary: Negative.   Musculoskeletal: Positive for back pain and myalgias. Negative for falls, joint pain and neck pain.  Skin: Positive for rash (eczema). Negative for itching.  Neurological: Positive for tingling (in her R hand with extended use). Negative for dizziness, tremors, sensory change, speech change, focal weakness, seizures, loss of consciousness, weakness and headaches.  Endo/Heme/Allergies: Positive for polydipsia. Negative for environmental allergies. Does not bruise/bleed easily.  Psychiatric/Behavioral: Negative for depression, hallucinations, memory loss, substance abuse and suicidal ideas. The patient is nervous/anxious. The patient does not have insomnia.    All other ROS negative except what is listed above and in the HPI.        Objective:    BP (!) 176/126   Pulse 87   Ht 5\' 1"  (1.549 m)   Wt 194 lb 14.4 oz (88.4 kg)   SpO2 98%   BMI 36.83 kg/m   Wt Readings from Last 3 Encounters:  01/02/18 194 lb 14.4 oz (88.4 kg)  11/16/17 191 lb 1 oz (86.7 kg)  10/10/17 192 lb 9 oz (87.3 kg)    Physical Exam  Constitutional: She is oriented to person, place, and time. She appears well-developed and well-nourished. No distress.  HENT:  Head: Normocephalic and atraumatic.  Right Ear: Hearing, tympanic membrane,  external ear and ear canal normal.  Left Ear: Hearing, tympanic membrane, external ear and ear canal normal.  Nose: Nose normal.  Mouth/Throat: Uvula is midline, oropharynx is clear and moist and mucous membranes are normal. No oropharyngeal exudate.  Eyes: Pupils are equal, round, and reactive to light. Conjunctivae, EOM and lids are normal. Right eye exhibits no discharge. Left eye exhibits no discharge. No scleral icterus.  Neck: Normal range of motion. Neck supple. No JVD present. No tracheal deviation present. No thyromegaly present.  Cardiovascular: Normal rate, regular rhythm, normal heart sounds and intact distal pulses. Exam reveals no gallop and no friction rub.  No murmur heard. Pulmonary/Chest: Effort normal and breath sounds normal. No stridor. No respiratory distress. She has no wheezes. She has no rales. She exhibits no tenderness. Right breast exhibits no inverted nipple, no mass, no nipple discharge, no skin change and no tenderness. Left breast exhibits no inverted nipple, no mass, no nipple discharge, no skin change and no tenderness. No breast swelling, tenderness, discharge or bleeding. Breasts are symmetrical.  Abdominal: Soft. Bowel sounds are normal. She exhibits no distension and no mass. There is no tenderness. There is no rebound and no guarding. No hernia. Hernia confirmed negative in the right inguinal area and confirmed negative in the left inguinal area.  Genitourinary: Vagina  normal and uterus normal. No labial fusion. There is no rash, tenderness, lesion or injury on the right labia. There is no rash, tenderness, lesion or injury on the left labia. Uterus is not deviated, not enlarged, not fixed and not tender. Cervix exhibits friability. Cervix exhibits no motion tenderness and no discharge. Right adnexum displays no mass, no tenderness and no fullness. Left adnexum displays no mass, no tenderness and no fullness. No erythema, tenderness or bleeding in the vagina. No foreign body in the vagina. No signs of injury around the vagina. No vaginal discharge found.  Musculoskeletal: She exhibits tenderness (Low back).  Low back Decreased ROM  Lymphadenopathy:    She has no cervical adenopathy.  Neurological: She is alert and oriented to person, place, and time. She displays normal reflexes. No cranial nerve deficit or sensory deficit. She exhibits normal muscle tone. Coordination normal.  Skin: Skin is warm, dry and intact. Capillary refill takes less than 2 seconds. Rash (eczema on feet and L leg) noted. She is not diaphoretic. No erythema. No pallor.  Psychiatric: She has a normal mood and affect. Her speech is normal and behavior is normal. Judgment and thought content normal. Cognition and memory are normal.  Nursing note and vitals reviewed.   Results for orders placed or performed during the hospital encounter of 08/13/16  Culture, blood (Routine X 2) w Reflex to ID Panel  Result Value Ref Range   Specimen Description BLOOD LEFT WRIST    Special Requests BOTTLES DRAWN AEROBIC AND ANAEROBIC AER9ML ANA7ML    Culture NO GROWTH 5 DAYS    Report Status 08/18/2016 FINAL   Culture, blood (Routine X 2) w Reflex to ID Panel  Result Value Ref Range   Specimen Description BLOOD RIGHT ASSIST CONTROL    Special Requests BOTTLES DRAWN AEROBIC AND ANAEROBIC ANA6ML AER5ML    Culture  Setup Time      GRAM POSITIVE COCCI ANAEROBIC BOTTLE ONLY CRITICAL RESULT CALLED TO, READ BACK  BY AND VERIFIED WITH: JASON ROBBINS ON 08/14/16 AT [address] [facility]    Culture (A)     STAPHYLOCOCCUS SPECIES (COAGULASE NEGATIVE) THE SIGNIFICANCE OF ISOLATING THIS ORGANISM FROM A SINGLE  SET OF BLOOD CULTURES WHEN MULTIPLE SETS ARE DRAWN IS UNCERTAIN. PLEASE NOTIFY THE MICROBIOLOGY DEPARTMENT WITHIN ONE WEEK IF SPECIATION AND SENSITIVITIES ARE REQUIRED. Performed at [facility], [address] [place], [place] [address]    Report Status 08/17/2016 FINAL   Urine culture  Result Value Ref Range   Specimen Description URINE, RANDOM    Special Requests NONE    Culture MULTIPLE SPECIES PRESENT, SUGGEST RECOLLECTION (A)    Report Status 08/15/2016 FINAL   Blood Culture ID Panel (Reflexed)  Result Value Ref Range   Enterococcus species NOT DETECTED NOT DETECTED   Listeria monocytogenes NOT DETECTED NOT DETECTED   Staphylococcus species DETECTED (A) NOT DETECTED   Staphylococcus aureus NOT DETECTED NOT DETECTED   Methicillin resistance DETECTED (A) NOT DETECTED   Streptococcus species NOT DETECTED NOT DETECTED   Streptococcus agalactiae NOT DETECTED NOT DETECTED   Streptococcus pneumoniae NOT DETECTED NOT DETECTED   Streptococcus pyogenes NOT DETECTED NOT DETECTED   Acinetobacter baumannii NOT DETECTED NOT DETECTED   Enterobacteriaceae species NOT DETECTED NOT DETECTED   Enterobacter cloacae complex NOT DETECTED NOT DETECTED   Escherichia coli NOT DETECTED NOT DETECTED   Klebsiella oxytoca NOT DETECTED NOT DETECTED   Klebsiella pneumoniae NOT DETECTED NOT DETECTED   Proteus species NOT DETECTED NOT DETECTED   Serratia marcescens NOT DETECTED NOT DETECTED   Haemophilus influenzae NOT DETECTED NOT DETECTED   Neisseria meningitidis NOT DETECTED NOT DETECTED   Pseudomonas aeruginosa NOT DETECTED NOT DETECTED   Candida albicans NOT DETECTED NOT DETECTED   Candida glabrata NOT DETECTED NOT DETECTED   Candida krusei NOT DETECTED NOT DETECTED   Candida parapsilosis NOT DETECTED NOT DETECTED     Candida tropicalis NOT DETECTED NOT DETECTED  CBC  Result Value Ref Range   WBC 8.2 3.6 - 11.0 K/uL   RBC 4.14 3.80 - 5.20 MIL/uL   Hemoglobin 14.1 12.0 - 16.0 g/dL   HCT 60.4 54.0 - 98.1 %   MCV 96.3 80.0 - 100.0 fL   MCH 34.0 26.0 - 34.0 pg   MCHC 35.3 32.0 - 36.0 g/dL   RDW 19.1 47.8 - 29.5 %   Platelets 267 150 - 440 K/uL  Lactic acid, plasma  Result Value Ref Range   Lactic Acid, Venous 4.6 (HH) 0.5 - 1.9 mmol/L  Comprehensive metabolic panel  Result Value Ref Range   Sodium 140 135 - 145 mmol/L   Potassium 3.2 (L) 3.5 - 5.1 mmol/L   Chloride 104 101 - 111 mmol/L   CO2 19 (L) 22 - 32 mmol/L   Glucose, Bld 142 (H) 65 - 99 mg/dL   BUN 16 6 - 20 mg/dL   Creatinine, Ser 6.21 0.44 - 1.00 mg/dL   Calcium 9.2 8.9 - 30.8 mg/dL   Total Protein 7.7 6.5 - 8.1 g/dL   Albumin 4.6 3.5 - 5.0 g/dL   AST 25 15 - 41 U/L   ALT 18 14 - 54 U/L   Alkaline Phosphatase 59 38 - 126 U/L   Total Bilirubin 0.5 0.3 - 1.2 mg/dL   GFR calc non Af Amer >60 >60 mL/min   GFR calc Af Amer >60 >60 mL/min   Anion gap 17 (H) 5 - 15  Lipase, blood  Result Value Ref Range   Lipase 64 (H) 11 - 51 U/L  Troponin I  Result Value Ref Range   Troponin I <0.03 <0.03 ng/mL  hCG, quantitative, pregnancy  Result Value Ref Range   hCG, Beta Chain,  Quant, S <1 <5 mIU/mL  Urinalysis, Complete w Microscopic  Result Value Ref Range   Color, Urine YELLOW (A) YELLOW   APPearance HAZY (A) CLEAR   Specific Gravity, Urine 1.012 1.005 - 1.030   pH 6.0 5.0 - 8.0   Glucose, UA 50 (A) NEGATIVE mg/dL   Hgb urine dipstick SMALL (A) NEGATIVE   Bilirubin Urine NEGATIVE NEGATIVE   Ketones, ur 5 (A) NEGATIVE mg/dL   Protein, ur 161 (A) NEGATIVE mg/dL   Nitrite NEGATIVE NEGATIVE   Leukocytes, UA TRACE (A) NEGATIVE   RBC / HPF 0-5 0 - 5 RBC/hpf   WBC, UA 6-30 0 - 5 WBC/hpf   Bacteria, UA FEW (A) NONE SEEN   Squamous Epithelial / LPF 0-5 (A) NONE SEEN   Mucus PRESENT   Influenza panel by PCR (type A & B)  Result Value  Ref Range   Influenza A By PCR NEGATIVE NEGATIVE   Influenza B By PCR NEGATIVE NEGATIVE  Ethanol  Result Value Ref Range   Alcohol, Ethyl (B) 212 (H) <5 mg/dL  Acetaminophen level  Result Value Ref Range   Acetaminophen (Tylenol), Serum <10 (L) 10 - 30 ug/mL  Salicylate level  Result Value Ref Range   Salicylate Lvl <7.0 2.8 - 30.0 mg/dL  Urine Drug Screen, Qualitative ([facility] only)  Result Value Ref Range   Tricyclic, Ur Screen NONE DETECTED NONE DETECTED   Amphetamines, Ur Screen NONE DETECTED NONE DETECTED   MDMA (Ecstasy)Ur Screen NONE DETECTED NONE DETECTED   Cocaine Metabolite,Ur [place] NONE DETECTED NONE DETECTED   Opiate, Ur Screen NONE DETECTED NONE DETECTED   Phencyclidine (PCP) Ur S NONE DETECTED NONE DETECTED   Cannabinoid 50 Ng, Ur [place] NONE DETECTED NONE DETECTED   Barbiturates, Ur Screen NONE DETECTED NONE DETECTED   Benzodiazepine, Ur Scrn NONE DETECTED NONE DETECTED   Methadone Scn, Ur NONE DETECTED NONE DETECTED  Glucose, capillary  Result Value Ref Range   Glucose-Capillary 139 (H) 65 - 99 mg/dL  Lactic acid, plasma  Result Value Ref Range   Lactic Acid, Venous 4.1 (HH) 0.5 - 1.9 mmol/L  Magnesium  Result Value Ref Range   Magnesium 2.1 1.7 - 2.4 mg/dL  Lactic acid, plasma  Result Value Ref Range   Lactic Acid, Venous 2.5 (HH) 0.5 - 1.9 mmol/L  Lactic acid, plasma  Result Value Ref Range   Lactic Acid, Venous 2.4 (HH) 0.5 - 1.9 mmol/L  Basic metabolic panel  Result Value Ref Range   Sodium 138 135 - 145 mmol/L   Potassium 3.9 3.5 - 5.1 mmol/L   Chloride 112 (H) 101 - 111 mmol/L   CO2 19 (L) 22 - 32 mmol/L   Glucose, Bld 85 65 - 99 mg/dL   BUN 15 6 - 20 mg/dL   Creatinine, Ser 0.96 0.44 - 1.00 mg/dL   Calcium 8.0 (L) 8.9 - 10.3 mg/dL   GFR calc non Af Amer >60 >60 mL/min   GFR calc Af Amer >60 >60 mL/min   Anion gap 7 5 - 15  CBC  Result Value Ref Range   WBC 10.7 3.6 - 11.0 K/uL   RBC 3.31 (L) 3.80 - 5.20 MIL/uL   Hemoglobin 11.2 (L) 12.0 - 16.0 g/dL    HCT 04.5 (L) 40.9 - 47.0 %   MCV 97.0 80.0 - 100.0 fL   MCH 33.7 26.0 - 34.0 pg   MCHC 34.8 32.0 - 36.0 g/dL   RDW 81.1 91.4 - 78.2 %   Platelets 219  150 - 440 K/uL  Lactic acid, plasma  Result Value Ref Range   Lactic Acid, Venous 2.5 (HH) 0.5 - 1.9 mmol/L  Hemoglobin  Result Value Ref Range   Hemoglobin 11.7 (L) 12.0 - 16.0 g/dL      Assessment & Plan:   Problem List Items Addressed This Visit      Cardiovascular and Mediastinum   Essential hypertension    Will start 10mg  lisinopril and recheck 1 month. Call with any concerns.       Relevant Medications   lisinopril (PRINIVIL,ZESTRIL) 10 MG tablet     Musculoskeletal and Integument   Eczema    Will treat with triamcinalone. Call with any concerns. Continue to monitor. Call with any concerns.        Other Visit Diagnoses    Routine general medical examination at a health care facility    -  Primary   Vaccines up to date. Screening labs checked today. Pap done. Continue diet and exercise. Call with any concerns. Continue to monitor.    Relevant Orders   CBC with Differential/Platelet   Comprehensive metabolic panel   Lipid Panel w/o Chol/HDL Ratio   TSH   UA/M w/rflx Culture, Routine   Screening for cervical cancer       Pap done today. Await results.    Relevant Orders   Cytology - PAP   Blood pressure elevated without history of HTN       Quite elevated, Not the first time. Will diagnose with HTN and start lisinopril. Recheck 1 month.    Relevant Orders   Microalbumin, Urine Waived   Other intervertebral disc degeneration, lumbar region       Relevant Orders   MR Lumbar Spine Wo Contrast   Chronic left-sided low back pain with left-sided sciatica       Not better with PT. Has been going on 5 months. Will obtain MRI and recheck. Call with any concerns.    Relevant Orders   MR Lumbar Spine Wo Contrast   Immunization due       HPV #1 given today.   Relevant Orders   HPV 9-valent vaccine,Recombinat  (Completed)   HPV 9-valent vaccine,Recombinat   HPV 9-valent vaccine,Recombinat       Follow up plan: Return in about 1 month (around 01/30/2018) for Follow up BP.   LABORATORY TESTING:  - Pap smear: pap done  IMMUNIZATIONS:   - Tdap: Tetanus vaccination status reviewed: last tetanus booster within 10 years. - Influenza: Postponed to flu season - Pneumovax: Up to date - Prevnar: Not applicable - HPV: Administered today  PATIENT COUNSELING:   Advised to take 1 mg of folate supplement per day if capable of pregnancy.   Sexuality: Discussed sexually transmitted diseases, partner selection, use of condoms, avoidance of unintended pregnancy  and contraceptive alternatives.   Advised to avoid cigarette smoking.  I discussed with the patient that most people either abstain from alcohol or drink within safe limits (<=14/week and <=4 drinks/occasion for males, <=7/weeks and <= 3 drinks/occasion for females) and that the risk for alcohol disorders and other health effects rises proportionally with the number of drinks per week and how often a drinker exceeds daily limits.  Discussed cessation/primary prevention of drug use and availability of treatment for abuse.   Diet: Encouraged to adjust caloric intake to maintain  or achieve ideal body weight, to reduce intake of dietary saturated fat and total fat, to limit sodium intake by avoiding high sodium foods and  not adding table salt, and to maintain adequate dietary potassium and calcium preferably from fresh fruits, vegetables, and low-fat dairy products.    stressed the importance of regular exercise  Injury prevention: Discussed safety belts, safety helmets, smoke detector, smoking near bedding or upholstery.   Dental health: Discussed importance of regular tooth brushing, flossing, and dental visits.    NEXT PREVENTATIVE PHYSICAL DUE IN 1 YEAR. Return in about 1 month (around 01/30/2018) for Follow up BP.

## 2018-01-03 ENCOUNTER — Encounter: Payer: Self-pay | Admitting: Family Medicine

## 2018-01-03 ENCOUNTER — Telehealth: Payer: Self-pay | Admitting: Family Medicine

## 2018-01-03 DIAGNOSIS — E782 Mixed hyperlipidemia: Secondary | ICD-10-CM

## 2018-01-03 DIAGNOSIS — R7989 Other specified abnormal findings of blood chemistry: Secondary | ICD-10-CM

## 2018-01-03 DIAGNOSIS — E785 Hyperlipidemia, unspecified: Secondary | ICD-10-CM | POA: Insufficient documentation

## 2018-01-03 DIAGNOSIS — Z124 Encounter for screening for malignant neoplasm of cervix: Secondary | ICD-10-CM

## 2018-01-03 LAB — CBC WITH DIFFERENTIAL/PLATELET
BASOS: 0 %
Basophils Absolute: 0 10*3/uL (ref 0.0–0.2)
EOS (ABSOLUTE): 0.2 10*3/uL (ref 0.0–0.4)
EOS: 2 %
HEMATOCRIT: 40.7 % (ref 34.0–46.6)
Hemoglobin: 14.1 g/dL (ref 11.1–15.9)
Immature Grans (Abs): 0 10*3/uL (ref 0.0–0.1)
Immature Granulocytes: 0 %
LYMPHS ABS: 2.8 10*3/uL (ref 0.7–3.1)
Lymphs: 27 %
MCH: 33.7 pg — AB (ref 26.6–33.0)
MCHC: 34.6 g/dL (ref 31.5–35.7)
MCV: 97 fL (ref 79–97)
MONOS ABS: 0.6 10*3/uL (ref 0.1–0.9)
Monocytes: 6 %
Neutrophils Absolute: 6.7 10*3/uL (ref 1.4–7.0)
Neutrophils: 65 %
PLATELETS: 254 10*3/uL (ref 150–450)
RBC: 4.19 x10E6/uL (ref 3.77–5.28)
RDW: 12.9 % (ref 12.3–15.4)
WBC: 10.4 10*3/uL (ref 3.4–10.8)

## 2018-01-03 LAB — TSH: TSH: 5.38 u[IU]/mL — AB (ref 0.450–4.500)

## 2018-01-03 LAB — COMPREHENSIVE METABOLIC PANEL
ALK PHOS: 69 IU/L (ref 39–117)
ALT: 25 IU/L (ref 0–32)
AST: 25 IU/L (ref 0–40)
Albumin/Globulin Ratio: 2 (ref 1.2–2.2)
Albumin: 4.8 g/dL (ref 3.5–5.5)
BUN/Creatinine Ratio: 13 (ref 9–23)
BUN: 10 mg/dL (ref 6–20)
Bilirubin Total: 0.2 mg/dL (ref 0.0–1.2)
CALCIUM: 10.3 mg/dL — AB (ref 8.7–10.2)
CO2: 19 mmol/L — ABNORMAL LOW (ref 20–29)
Chloride: 100 mmol/L (ref 96–106)
Creatinine, Ser: 0.75 mg/dL (ref 0.57–1.00)
GFR calc Af Amer: 118 mL/min/{1.73_m2} (ref >59)
GFR, EST NON AFRICAN AMERICAN: 102 mL/min/{1.73_m2} (ref >59)
GLOBULIN, TOTAL: 2.4 g/dL (ref 1.5–4.5)
Glucose: 80 mg/dL (ref 65–99)
POTASSIUM: 4.7 mmol/L (ref 3.5–5.2)
SODIUM: 138 mmol/L (ref 134–144)
Total Protein: 7.2 g/dL (ref 6.0–8.5)

## 2018-01-03 LAB — LIPID PANEL W/O CHOL/HDL RATIO
Cholesterol, Total: 293 mg/dL — ABNORMAL HIGH (ref 100–199)
HDL: 46 mg/dL (ref >39)
LDL Calculated: 182 mg/dL — ABNORMAL HIGH (ref 0–99)
TRIGLYCERIDES: 327 mg/dL — AB (ref 0–149)
VLDL Cholesterol Cal: 65 mg/dL — ABNORMAL HIGH (ref 5–40)

## 2018-01-03 NOTE — Telephone Encounter (Signed)
Would you please let her know that her labs came back mostly good but that her thyroid is very slightly under active and that her cholesterol is quite high. I'm going to recheck her thyroid when she comes back in a month to see if it was a lab fluke or if it's real. Can you make sure she was fasting yesterday? If she wasn't I'll recheck her cholesterol fasting when she comes back in a month. If she was, I'd like her to really watch her diet and avoid fried and fatty foods and increase her exercise if she can and we'll recheck her cholesterol in 6 months. Thanks!

## 2018-01-03 NOTE — Telephone Encounter (Signed)
Letter will be generated and mailed to the patient.

## 2018-01-03 NOTE — Telephone Encounter (Signed)
Letter generated

## 2018-01-03 NOTE — Telephone Encounter (Signed)
Please advise thank you

## 2018-01-03 NOTE — Telephone Encounter (Signed)
Copied from CRM [id number]. Topic: Quick Communication - See Telephone Encounter >> Jan 03, 2018  8:17 AM Tamela OddiMartin, [place], NT wrote: CRM for notification. See Telephone encounter for: 01/03/18.Malachi BondsGloria calling [facility] states that a specimen yesterday and the only paperwork that came with the specimen  was a E- Req. She states she needs an order place in Epic.  Please place order.  Can you call Malachi BondsGloria when that order is placed  CB# [phone]

## 2018-01-03 NOTE — Addendum Note (Signed)
Addended by: Dorcas CarrowJOHNSON, Cathryne Mancebo P on: 01/03/2018 09:27 AM   Modules accepted: Orders

## 2018-01-03 NOTE — Telephone Encounter (Signed)
Pap order updated.

## 2018-01-03 NOTE — Telephone Encounter (Signed)
Tried to call patient, phone number has been changed.

## 2018-01-04 ENCOUNTER — Encounter: Payer: Self-pay | Admitting: Family Medicine

## 2018-01-04 LAB — CYTOLOGY - PAP
DIAGNOSIS: NEGATIVE
HPV (WINDOPATH): NOT DETECTED

## 2018-02-02 ENCOUNTER — Other Ambulatory Visit: Payer: Self-pay

## 2018-02-02 ENCOUNTER — Encounter: Payer: Self-pay | Admitting: Family Medicine

## 2018-02-02 ENCOUNTER — Ambulatory Visit (INDEPENDENT_AMBULATORY_CARE_PROVIDER_SITE_OTHER): Admitting: Family Medicine

## 2018-02-02 VITALS — BP 134/86 | HR 80 | Temp 98.2°F | Ht 61.0 in | Wt 192.6 lb

## 2018-02-02 DIAGNOSIS — Z23 Encounter for immunization: Secondary | ICD-10-CM

## 2018-02-02 DIAGNOSIS — R7989 Other specified abnormal findings of blood chemistry: Secondary | ICD-10-CM

## 2018-02-02 DIAGNOSIS — I10 Essential (primary) hypertension: Secondary | ICD-10-CM

## 2018-02-02 DIAGNOSIS — F419 Anxiety disorder, unspecified: Secondary | ICD-10-CM

## 2018-02-02 MED ORDER — LISINOPRIL 10 MG PO TABS: 10.0000 mg | ORAL_TABLET | Freq: Every day | ORAL | 1 refills | Status: DC

## 2018-02-02 MED ORDER — NAPROXEN 500 MG PO TABS: 500.0000 mg | ORAL_TABLET | Freq: Two times a day (BID) | ORAL | 3 refills | Status: DC

## 2018-02-02 MED ORDER — HYDROXYZINE HCL 25 MG PO TABS: 12.5000 mg | ORAL_TABLET | Freq: Three times a day (TID) | ORAL | 6 refills | Status: DC | PRN

## 2018-02-02 NOTE — Progress Notes (Signed)
BP 134/86 (BP Location: Left Arm, Cuff Size: Normal)   Pulse 80   Temp 98.2 F (36.8 C) (Oral)   Ht 5\' 1"  (1.549 m)   Wt 192 lb 9.6 oz (87.4 kg)   SpO2 98%   BMI 36.39 kg/m    Subjective:    Patient ID: Cynthia Barnes, female    DOB: 1980-12-20, 37 y.o.   MRN: [id number]  HPI: Cynthia Barnes is a 37 y.o. female  Chief Complaint  Patient presents with  . Hypertension   Still having some pain in her leg- doing much better. Anxiety is doing well. No concerns.   HYPERTENSION Hypertension status: better  Satisfied with current treatment? yes Duration of hypertension: chronic BP monitoring frequency:  not checking BP medication side effects:  no Medication compliance: excellent compliance Previous BP meds: lisinopril Aspirin: no Recurrent headaches: no Visual changes: no Palpitations: no Dyspnea: no Chest pain: no Lower extremity edema: no Dizzy/lightheaded: no  Relevant past medical, surgical, family and social history reviewed and updated as indicated. Interim medical history since our last visit reviewed. Allergies and medications reviewed and updated.  Review of Systems  Constitutional: Negative.   Respiratory: Negative.   Cardiovascular: Negative.   Psychiatric/Behavioral: Negative.     Per HPI unless specifically indicated above     Objective:    BP 134/86 (BP Location: Left Arm, Cuff Size: Normal)   Pulse 80   Temp 98.2 F (36.8 C) (Oral)   Ht 5\' 1"  (1.549 m)   Wt 192 lb 9.6 oz (87.4 kg)   SpO2 98%   BMI 36.39 kg/m   Wt Readings from Last 3 Encounters:  02/02/18 192 lb 9.6 oz (87.4 kg)  01/02/18 194 lb 14.4 oz (88.4 kg)  11/16/17 191 lb 1 oz (86.7 kg)    Physical Exam  Constitutional: She is oriented to person, place, and time. She appears well-developed and well-nourished. No distress.  HENT:  Head: Normocephalic and atraumatic.  Right Ear: Hearing normal.  Left Ear: Hearing normal.  Nose: Nose normal.  Eyes: Conjunctivae  and lids are normal. Right eye exhibits no discharge. Left eye exhibits no discharge. No scleral icterus.  Cardiovascular: Normal rate, regular rhythm, normal heart sounds and intact distal pulses. Exam reveals no gallop and no friction rub.  No murmur heard. Pulmonary/Chest: Effort normal and breath sounds normal. No stridor. No respiratory distress. She has no wheezes. She has no rales. She exhibits no tenderness.  Musculoskeletal: Normal range of motion.  Neurological: She is alert and oriented to person, place, and time.  Skin: Skin is warm, dry and intact. Capillary refill takes less than 2 seconds. No rash noted. She is not diaphoretic. No erythema. No pallor.  Psychiatric: She has a normal mood and affect. Her speech is normal and behavior is normal. Judgment and thought content normal. Cognition and memory are normal.  Nursing note and vitals reviewed.   Results for orders placed or performed in visit on 01/03/18  Cytology - PAP  Result Value Ref Range   Adequacy      Satisfactory for evaluation  endocervical/transformation zone component PRESENT.   Diagnosis      NEGATIVE FOR INTRAEPITHELIAL LESIONS OR MALIGNANCY.   HPV NOT DETECTED    Material Submitted CervicoVaginal Pap [ThinPrep Imaged]       Assessment & Plan:   Problem List Items Addressed This Visit      Cardiovascular and Mediastinum   Essential hypertension - Primary    Under good control on  recheck. Continue current regimen. Continue to monitor. Call with any concerns. Refills given.       Relevant Medications   lisinopril (PRINIVIL,ZESTRIL) 10 MG tablet   Other Relevant Orders   Basic metabolic panel     Other   Anxiety    Stable. Continue current regimen. Continue to monitor. Call with any concerns.       Relevant Medications   hydrOXYzine (ATARAX/VISTARIL) 25 MG tablet    Other Visit Diagnoses    Abnormal thyroid blood test       Rechecking levels today. Await results. Call with any concerns.     Immunization due       HPV #2 given today.       Follow up plan: Return in about 6 months (around 08/05/2018).

## 2018-02-02 NOTE — Assessment & Plan Note (Signed)
Under good control on recheck. Continue current regimen. Continue to monitor. Call with any concerns. Refills given.  

## 2018-02-02 NOTE — Assessment & Plan Note (Signed)
Stable. Continue current regimen. Continue to monitor. Call with any concerns.  

## 2018-02-03 ENCOUNTER — Encounter: Payer: Self-pay | Admitting: Family Medicine

## 2018-02-03 LAB — BASIC METABOLIC PANEL
BUN/Creatinine Ratio: 13 (ref 9–23)
BUN: 11 mg/dL (ref 6–20)
CO2: 17 mmol/L — AB (ref 20–29)
Calcium: 10 mg/dL (ref 8.7–10.2)
Chloride: 103 mmol/L (ref 96–106)
Creatinine, Ser: 0.83 mg/dL (ref 0.57–1.00)
GFR calc Af Amer: 104 mL/min/{1.73_m2} (ref >59)
GFR calc non Af Amer: 90 mL/min/{1.73_m2} (ref >59)
GLUCOSE: 89 mg/dL (ref 65–99)
POTASSIUM: 4.9 mmol/L (ref 3.5–5.2)
SODIUM: 140 mmol/L (ref 134–144)

## 2018-02-03 LAB — THYROID PANEL WITH TSH
FREE THYROXINE INDEX: 2 (ref 1.2–4.9)
T3 UPTAKE RATIO: 25 % (ref 24–39)
T4, Total: 8 ug/dL (ref 4.5–12.0)
TSH: 4.02 u[IU]/mL (ref 0.450–4.500)

## 2018-07-19 ENCOUNTER — Ambulatory Visit: Payer: Self-pay | Admitting: *Deleted

## 2018-07-19 NOTE — Telephone Encounter (Signed)
Pt reports hydroxyzine "Not helping as much."  States Dr. Laural Benes increased dosage to 1- 2 tabs as needed in Aug. 2019. States eczema "Not worse, not spreading." States husband is moving to [place] for 3 years on Monday ([employer]) and is anticipating additional stressors "Which makes it worse." Questioning if Dr.Johnson would increase dose further or is there an alternative medication. Ordered 02/02/18 #90.  Pt states does not use often.  Please advise: [phone]   Reason for Disposition . Caller has NON-URGENT medication question about med that PCP prescribed and triager unable to answer question  Answer Assessment - Initial Assessment Questions 1. SYMPTOMS: "Do you have any symptoms?"    itching 2. SEVERITY: If symptoms are present, ask "Are they mild, moderate or severe?"    "Just not helping much anymore."  Protocols used: MEDICATION QUESTION CALL-A-AH

## 2018-07-20 MED ORDER — HYDROXYZINE HCL 25 MG PO TABS: 25.0000 mg | ORAL_TABLET | Freq: Three times a day (TID) | ORAL | 1 refills | Status: DC | PRN

## 2018-07-20 MED ORDER — HYDROXYZINE HCL 25 MG PO TABS: 25.0000 mg | ORAL_TABLET | Freq: Three times a day (TID) | ORAL | 0 refills | Status: DC | PRN

## 2018-07-20 NOTE — Telephone Encounter (Signed)
She can take 3 PRN, but it sounds like she may need something more- let's get her scheduled for a follow up to try to get ahead of this

## 2018-07-20 NOTE — Telephone Encounter (Signed)
Please send in a new script to [facility] for a month supply and then a 90 day to mail order

## 2018-07-20 NOTE — Addendum Note (Signed)
Addended by: Dorcas Carrow on: 07/20/2018 04:42 PM   Modules accepted: Orders

## 2018-10-31 ENCOUNTER — Ambulatory Visit: Payer: Self-pay | Admitting: *Deleted

## 2018-10-31 NOTE — Telephone Encounter (Signed)
Pt reports swollen, red and tender left great toe, extending to 2nd toe. Area is top side near joint. States tender to touch. Does not recall injury or insect bite. States area second toe "Purplish" warm to touch. Also requesting alternative med for anxiety, states "What I've been taking isn't helping much anymore." Email and phone number verified. Call transferred to practice, [facility], for consideration of appt. Reason for Disposition . [1] Swollen toe AND [2] no fever  (Exceptions: just localized bump from bunion, corns, insect bite, sting)  Answer Assessment - Initial Assessment Questions 1. ONSET: "When did the pain start?"      Tenderness, 3 days ago 2. LOCATION: "Where is the pain located?"   (e.g., around nail, entire toe, at foot joint)      Left great toe, to  joint, top part 2nd toe 3. PAIN: "How bad is the pain?"    (Scale 1-10; or mild, moderate, severe)   -  MILD (1-3): doesn't interfere with normal activities    -  MODERATE (4-7): interferes with normal activities (e.g., work or school) or awakens from sleep, limping    -  SEVERE (8-10): excruciating pain, unable to do any normal activities, unable to walk     Tender to touch, mild swelling 4. APPEARANCE: "What does the toe look like?" (e.g., redness, swelling, bruising, pallor)     Red, purple areas 5. CAUSE: "What do you think is causing the toe pain?"     unsure 6. OTHER SYMPTOMS: "Do you have any other symptoms?" (e.g., leg pain, rash, fever, numbness)     Pimple like pustule on gt toe with yellow drainage, since resolved.  Protocols used: TOE PAIN-A-AH

## 2018-11-01 ENCOUNTER — Other Ambulatory Visit: Payer: Self-pay

## 2018-11-01 ENCOUNTER — Encounter: Payer: Self-pay | Admitting: Family Medicine

## 2018-11-01 ENCOUNTER — Ambulatory Visit (INDEPENDENT_AMBULATORY_CARE_PROVIDER_SITE_OTHER): Admitting: Family Medicine

## 2018-11-01 DIAGNOSIS — I10 Essential (primary) hypertension: Secondary | ICD-10-CM | POA: Diagnosis not present

## 2018-11-01 DIAGNOSIS — F419 Anxiety disorder, unspecified: Secondary | ICD-10-CM | POA: Diagnosis not present

## 2018-11-01 MED ORDER — LISINOPRIL 10 MG PO TABS: 10.0000 mg | ORAL_TABLET | Freq: Every day | ORAL | 1 refills | Status: DC

## 2018-11-01 MED ORDER — ESCITALOPRAM OXALATE 5 MG PO TABS: 5.0000 mg | ORAL_TABLET | Freq: Every day | ORAL | 2 refills | Status: DC

## 2018-11-01 MED ORDER — LORAZEPAM 0.5 MG PO TABS: 0.2500 mg | ORAL_TABLET | Freq: Every day | ORAL | 0 refills | Status: DC

## 2018-11-01 NOTE — Assessment & Plan Note (Signed)
Worsening with her husband working out of state and the coronavirus pandemic. Will start lexapro and give short course of ativan PRN. Continue hydroxyzine. Call with any concerns. Continue to monitor. Recheck 1 month.

## 2018-11-01 NOTE — Assessment & Plan Note (Signed)
Feeling well. Will get her in for BP check and labs. Refills given today. Call with any concerns.

## 2018-11-01 NOTE — Progress Notes (Signed)
There were no vitals taken for this visit.   Subjective:    Patient ID: Cynthia Barnes, female    DOB: [birth year], 38 y.o.   MRN: [id number]  HPI: Cynthia Barnes is a 38 y.o. female  Chief Complaint  Patient presents with  . Anxiety  . Hypertension   ANXIETY/STRESS- husband is in [place] right now. Hydroxyzine helps, but it takes a while to kick in and then lasts for a long time.  Duration:exacerbated Anxious mood: yes  Excessive worrying: yes Irritability: yes  Sweating: no Nausea: no Palpitations:no Hyperventilation: no Panic attacks: yes- daily to different degrees Agoraphobia: no  Obscessions/compulsions: no Depressed mood: no Depression screen [facility] 2/9 11/01/2018 01/02/2018 10/10/2017  Decreased Interest 2 0 0  Down, Depressed, Hopeless 0 0 0  PHQ - 2 Score 2 0 0  Altered sleeping 3 2 -  Tired, decreased energy 1 1 -  Change in appetite 1 1 -  Feeling bad or failure about yourself  1 0 -  Trouble concentrating 1 0 -  Moving slowly or fidgety/restless 0 0 -  Suicidal thoughts 0 0 -  PHQ-9 Score 9 4 -  Difficult doing work/chores Somewhat difficult Not difficult at all -   GAD 7 : Generalized Anxiety Score 11/01/2018 02/02/2018 01/02/2018  Nervous, Anxious, on Edge 2 0 2  Control/stop worrying 2 0 1  Worry too much - different things Trouble relaxing Restless Easily annoyed or irritable 2 0 1  Afraid - awful might happen 2 0 1  Total GAD 7 Score Anxiety Difficulty Somewhat difficult Somewhat difficult Somewhat difficult   Anhedonia: no Weight changes: no Insomnia: no   Hypersomnia: no Fatigue/loss of energy: yes Feelings of worthlessness: no Feelings of guilt: no Impaired concentration/indecisiveness: no Suicidal ideations: no  Crying spells: no Recent Stressors/Life Changes: yes   Relationship problems: no   Family stress: yes     Financial stress: yes    Job stress: no    Recent death/loss: no  HYPERTENSION  Hypertension status: stable  Satisfied with current treatment? yes Duration of hypertension: chronic BP monitoring frequency:  not checking BP medication side effects:  no Medication compliance: excellent compliance Previous BP meds: lisinopril Aspirin: no Recurrent headaches: no Visual changes: no Palpitations: no Dyspnea: no Chest pain: no Lower extremity edema: no Dizzy/lightheaded: no   Relevant past medical, surgical, family and social history reviewed and updated as indicated. Interim medical history since our last visit reviewed. Allergies and medications reviewed and updated.  Review of Systems  Constitutional: Negative.   Respiratory: Negative.   Cardiovascular: Negative.   Musculoskeletal: Negative.   Skin: Negative.   Psychiatric/Behavioral: Positive for dysphoric mood and sleep disturbance. Negative for agitation, behavioral problems, confusion, decreased concentration, hallucinations, self-injury and suicidal ideas. The patient is nervous/anxious. The patient is not hyperactive.     Per HPI unless specifically indicated above     Objective:    There were no vitals taken for this visit.  Wt Readings from Last 3 Encounters:  02/02/18 192 lb 9.6 oz (87.4 kg)  01/02/18 194 lb 14.4 oz (88.4 kg)  11/16/17 191 lb 1 oz (86.7 kg)    Physical Exam Vitals signs and nursing note reviewed.  Constitutional:      General: She is not in acute distress.    Appearance: Normal appearance. She is not ill-appearing, toxic-appearing or diaphoretic.  HENT:     Head:  Normocephalic and atraumatic.     Right Ear: External ear normal.     Left Ear: External ear normal.     Nose: Nose normal.     Mouth/Throat:     Mouth: Mucous membranes are moist.     Pharynx: Oropharynx is clear.  Eyes:     General: No scleral icterus.       Right eye: No discharge.        Left eye: No discharge.     Conjunctiva/sclera: Conjunctivae normal.     Pupils: Pupils are equal, round, and  reactive to light.  Neck:     Musculoskeletal: Normal range of motion.  Pulmonary:     Effort: Pulmonary effort is normal. No respiratory distress.     Comments: Speaking in full sentences Musculoskeletal: Normal range of motion.  Skin:    Coloration: Skin is not jaundiced or pale.     Findings: No bruising, erythema, lesion or rash.  Neurological:     Mental Status: She is alert and oriented to person, place, and time. Mental status is at baseline.  Psychiatric:        Mood and Affect: Mood normal.        Behavior: Behavior normal.        Thought Content: Thought content normal.        Judgment: Judgment normal.     Results for orders placed or performed in visit on 02/02/18  Thyroid Panel With TSH  Result Value Ref Range   TSH 4.020 0.450 - 4.500 uIU/mL   T4, Total 8.0 4.5 - 12.0 ug/dL   T3 Uptake Ratio 25 24 - 39 %   Free Thyroxine Index 2.0 1.2 - 4.9  Basic metabolic panel  Result Value Ref Range   Glucose 89 65 - 99 mg/dL   BUN 11 6 - 20 mg/dL   Creatinine, Ser 2.97 0.57 - 1.00 mg/dL   GFR calc non Af Amer 90 >59 mL/min/1.73   GFR calc Af Amer 104 >59 mL/min/1.73   BUN/Creatinine Ratio 13 9 - 23   Sodium 140 134 - 144 mmol/L   Potassium 4.9 3.5 - 5.2 mmol/L   Chloride 103 96 - 106 mmol/L   CO2 17 (L) 20 - 29 mmol/L   Calcium 10.0 8.7 - 10.2 mg/dL      Assessment & Plan:   Problem List Items Addressed This Visit      Cardiovascular and Mediastinum   Essential hypertension - Primary    Feeling well. Will get her in for BP check and labs. Refills given today. Call with any concerns.       Relevant Medications   lisinopril (ZESTRIL) 10 MG tablet   Other Relevant Orders   Basic metabolic panel     Other   Anxiety    Worsening with her husband working out of state and the coronavirus pandemic. Will start lexapro and give short course of ativan PRN. Continue hydroxyzine. Call with any concerns. Continue to monitor. Recheck 1 month.       Relevant Medications    escitalopram (LEXAPRO) 5 MG tablet   LORazepam (ATIVAN) 0.5 MG tablet       Follow up plan: Return in about 4 weeks (around 11/29/2018) for follow up mood.    . This visit was completed via FaceTime due to the restrictions of the COVID-19 pandemic. All issues as above were discussed and addressed. Physical exam was done as above through visual confirmation on FaceTime. If it was felt  that the patient should be evaluated in the office, they were directed there. The patient verbally consented to this visit. . Location of the patient: home . Location of the provider: home . Those involved with this call:  . Provider: Olevia PerchesMegan , DO . CMA: Wilhemena DurieBrittany Russell, CMA . Front Desk/Registration: Adela Portshristan Williamson  . Time spent on call: 25 minutes with patient face to face via video conference. More than 50% of this time was spent in counseling and coordination of care. 40 minutes total spent in review of patient's record and preparation of their chart.

## 2018-11-23 ENCOUNTER — Other Ambulatory Visit: Payer: Self-pay

## 2018-11-23 ENCOUNTER — Other Ambulatory Visit

## 2018-11-23 DIAGNOSIS — I10 Essential (primary) hypertension: Secondary | ICD-10-CM

## 2018-11-24 ENCOUNTER — Other Ambulatory Visit

## 2018-11-24 ENCOUNTER — Encounter: Payer: Self-pay | Admitting: Family Medicine

## 2018-11-24 LAB — BASIC METABOLIC PANEL
BUN/Creatinine Ratio: 12 (ref 9–23)
BUN: 9 mg/dL (ref 6–20)
CO2: 21 mmol/L (ref 20–29)
Calcium: 9.6 mg/dL (ref 8.7–10.2)
Chloride: 100 mmol/L (ref 96–106)
Creatinine, Ser: 0.76 mg/dL (ref 0.57–1.00)
GFR calc Af Amer: 115 mL/min/{1.73_m2} (ref >59)
GFR calc non Af Amer: 100 mL/min/{1.73_m2} (ref >59)
Glucose: 83 mg/dL (ref 65–99)
Potassium: 4.7 mmol/L (ref 3.5–5.2)
Sodium: 140 mmol/L (ref 134–144)

## 2018-12-01 ENCOUNTER — Ambulatory Visit (INDEPENDENT_AMBULATORY_CARE_PROVIDER_SITE_OTHER): Admitting: Family Medicine

## 2018-12-01 ENCOUNTER — Other Ambulatory Visit: Payer: Self-pay

## 2018-12-01 ENCOUNTER — Encounter: Payer: Self-pay | Admitting: Family Medicine

## 2018-12-01 VITALS — Ht 59.0 in | Wt 185.0 lb

## 2018-12-01 DIAGNOSIS — F419 Anxiety disorder, unspecified: Secondary | ICD-10-CM

## 2018-12-01 MED ORDER — ESCITALOPRAM OXALATE 5 MG PO TABS: 5.0000 mg | ORAL_TABLET | Freq: Every day | ORAL | 1 refills | Status: DC

## 2018-12-01 MED ORDER — LORAZEPAM 0.5 MG PO TABS: 0.2500 mg | ORAL_TABLET | Freq: Every day | ORAL | 0 refills | Status: DC

## 2018-12-01 NOTE — Progress Notes (Signed)
Ht 4\' 11"  (1.499 m)    Wt 185 lb (83.9 kg)    BMI 37.37 kg/m    Subjective:    Patient ID: Cynthia Barnes, female    DOB: October 16, 1980, 38 y.o.   MRN: [id number]  HPI: Cynthia Barnes is a 38 y.o. female  Chief Complaint  Patient presents with   Anxiety    4w f/u   ANXIETY/STRESS Duration:starting to feel slightly better Anxious mood: yes  Excessive worrying: yes Irritability: yes  Sweating: no Nausea: no Palpitations:no Hyperventilation: no Panic attacks: no Agoraphobia: no  Obscessions/compulsions: no Depressed mood: yes Depression screen [facility] 2/9 12/01/2018 11/01/2018 01/02/2018 10/10/2017  Decreased Interest 1 2 0 0  Down, Depressed, Hopeless 0 0 0 0  PHQ - 2 Score 1 2 0 0  Altered sleeping 2 3 2  -  Tired, decreased energy 1 1 1  -  Change in appetite 1 1 1  -  Feeling bad or failure about yourself  0 1 0 -  Trouble concentrating 1 1 0 -  Moving slowly or fidgety/restless 0 0 0 -  Suicidal thoughts 0 0 0 -  PHQ-9 Score 6 9 4  -  Difficult doing work/chores - Somewhat difficult Not difficult at all -   GAD 7 : Generalized Anxiety Score 12/01/2018 11/01/2018 02/02/2018 01/02/2018  Nervous, Anxious, on Edge 2 2 0 2  Control/stop worrying 1 2 0 1  Worry too much - different things 1 2 1 1   Trouble relaxing 2 2 1 1   Restless 1 2 1 1   Easily annoyed or irritable 1 2 0 1  Afraid - awful might happen 0 2 0 1  Total GAD 7 Score 8 14 3 8   Anxiety Difficulty - Somewhat difficult Somewhat difficult Somewhat difficult   Anhedonia: no Weight changes: no Insomnia: no   Hypersomnia: no Fatigue/loss of energy: yes Feelings of worthlessness: no Feelings of guilt: yes Impaired concentration/indecisiveness: no Suicidal ideations: no  Crying spells: yes Recent Stressors/Life Changes: no   Relationship problems: no   Family stress: yes     Financial stress: no    Job stress: no    Recent death/loss: no  Relevant past medical, surgical, family and social history  reviewed and updated as indicated. Interim medical history since our last visit reviewed. Allergies and medications reviewed and updated.  Review of Systems  Constitutional: Negative.   Respiratory: Negative.   Cardiovascular: Negative.   Gastrointestinal: Negative.   Skin: Negative.   Psychiatric/Behavioral: Positive for dysphoric mood. Negative for agitation, behavioral problems, confusion, decreased concentration, hallucinations, self-injury, sleep disturbance and suicidal ideas. The patient is nervous/anxious. The patient is not hyperactive.     Per HPI unless specifically indicated above     Objective:    Ht 4\' 11"  (1.499 m)    Wt 185 lb (83.9 kg)    BMI 37.37 kg/m   Wt Readings from Last 3 Encounters:  12/01/18 185 lb (83.9 kg)  02/02/18 192 lb 9.6 oz (87.4 kg)  01/02/18 194 lb 14.4 oz (88.4 kg)    Physical Exam Vitals signs and nursing note reviewed.  Constitutional:      General: She is not in acute distress.    Appearance: Normal appearance. She is not ill-appearing, toxic-appearing or diaphoretic.  HENT:     Head: Normocephalic and atraumatic.     Right Ear: External ear normal.     Left Ear: External ear normal.     Nose: Nose normal.     Mouth/Throat:  Mouth: Mucous membranes are moist.     Pharynx: Oropharynx is clear.  Eyes:     General: No scleral icterus.       Right eye: No discharge.        Left eye: No discharge.     Conjunctiva/sclera: Conjunctivae normal.     Pupils: Pupils are equal, round, and reactive to light.  Neck:     Musculoskeletal: Normal range of motion.  Pulmonary:     Effort: Pulmonary effort is normal. No respiratory distress.     Comments: Speaking in full sentences Musculoskeletal: Normal range of motion.  Skin:    Coloration: Skin is not jaundiced or pale.     Findings: No bruising, erythema, lesion or rash.  Neurological:     Mental Status: She is alert and oriented to person, place, and time. Mental status is at baseline.   Psychiatric:        Mood and Affect: Mood normal.        Behavior: Behavior normal.        Thought Content: Thought content normal.        Judgment: Judgment normal.     Results for orders placed or performed in visit on 11/23/18  Basic metabolic panel  Result Value Ref Range   Glucose 83 65 - 99 mg/dL   BUN 9 6 - 20 mg/dL   Creatinine, Ser 7.820.76 0.57 - 1.00 mg/dL   GFR calc non Af Amer 100 >59 mL/min/1.73   GFR calc Af Amer 115 >59 mL/min/1.73   BUN/Creatinine Ratio 12 9 - 23   Sodium 140 134 - 144 mmol/L   Potassium 4.7 3.5 - 5.2 mmol/L   Chloride 100 96 - 106 mmol/L   CO2 21 20 - 29 mmol/L   Calcium 9.6 8.7 - 10.2 mg/dL      Assessment & Plan:   Problem List Items Addressed This Visit      Other   Anxiety - Primary    Doing a bit better. Will stay on current regimen and reche[place]. Call with any concerns. Continue to monitor. Refills given today.      Relevant Medications   escitalopram (LEXAPRO) 5 MG tablet   LORazepam (ATIVAN) 0.5 MG tablet       Follow up plan: Return October, for follow up.     This visit was completed via FaceTime due to the restrictions of the COVID-19 pandemic. All issues as above were discussed and addressed. Physical exam was done as above through visual confirmation on FaceTime. If it was felt that the patient should be evaluated in the office, they were directed there. The patient verbally consented to this visit.  Location of the patient: home  Location of the provider: work  Those involved with this call:   Provider: Olevia PerchesMegan Kayde Warehime, DO  CMA: Elton SinAnita Quito, CMA  Front Desk/Registration: Adela Portshristan Williamson   Time spent on call: 15 minutes with patient face to face via video conference. More than 50% of this time was spent in counseling and coordination of care. 23 minutes total spent in review of patient's record and preparation of their chart.

## 2018-12-01 NOTE — Assessment & Plan Note (Signed)
Doing a bit better. Will stay on current regimen and recheck when she returns from up [place]. Call with any concerns. Continue to monitor. Refills given today.

## 2019-01-12 ENCOUNTER — Other Ambulatory Visit: Payer: Self-pay

## 2019-01-12 ENCOUNTER — Ambulatory Visit (INDEPENDENT_AMBULATORY_CARE_PROVIDER_SITE_OTHER): Admitting: Family Medicine

## 2019-01-12 ENCOUNTER — Telehealth: Payer: Self-pay | Admitting: Family Medicine

## 2019-01-12 ENCOUNTER — Encounter: Payer: Self-pay | Admitting: Family Medicine

## 2019-01-12 DIAGNOSIS — F419 Anxiety disorder, unspecified: Secondary | ICD-10-CM | POA: Diagnosis not present

## 2019-01-12 DIAGNOSIS — M5442 Lumbago with sciatica, left side: Secondary | ICD-10-CM | POA: Diagnosis not present

## 2019-01-12 MED ORDER — NAPROXEN 500 MG PO TABS: 500.0000 mg | ORAL_TABLET | Freq: Two times a day (BID) | ORAL | 3 refills | Status: DC

## 2019-01-12 MED ORDER — CYCLOBENZAPRINE HCL 10 MG PO TABS: 10.0000 mg | ORAL_TABLET | Freq: Every day | ORAL | 0 refills | Status: DC

## 2019-01-12 MED ORDER — ESCITALOPRAM OXALATE 10 MG PO TABS: 10.0000 mg | ORAL_TABLET | Freq: Every day | ORAL | 3 refills | Status: DC

## 2019-01-12 MED ORDER — CLONAZEPAM 1 MG PO TABS: 1.0000 mg | ORAL_TABLET | Freq: Two times a day (BID) | ORAL | 1 refills | Status: AC | PRN

## 2019-01-12 NOTE — Progress Notes (Signed)
There were no vitals taken for this visit.   Subjective:    Patient ID: Cynthia Barnes, female    DOB: 1981-04-06, 38 y.o.   MRN: [id number]  HPI: Cynthia Barnes is a 38 y.o. female  Chief Complaint  Patient presents with  . Pain    pt states she has a pain in her left butt cheek that runs down leg, states it started Wednesday    BACK PAIN Duration: 3 days Mechanism of injury: driving back from [place] Location: L buttock Onset: sudden Severity: severe Quality: aching and sore  Frequency: constant Radiation: L leg above the knee Aggravating factors: lifting, movement and walking Alleviating factors: ice, heat, laying, NSAIDs and APAP Status: worse Treatments attempted: rest, ice, heat, APAP and ibuprofen  Relief with NSAIDs?: mild Nighttime pain:  yes Paresthesias / decreased sensation:  yes Bowel / bladder incontinence:  no Fevers:  no Dysuria / urinary frequency:  no  ANXIETY/STRESS- significantly worse. Husband wants her to move to [place] to be with him. Given the issues with COVID and all of her stress, she just feels like she's drowning right now. Not sleeping. Feeling terrible Duration:exacerbated Anxious mood: yes  Excessive worrying: yes Irritability: yes  Sweating: yes Nausea: yes Palpitations:yes Hyperventilation: yes Panic attacks: yes Agoraphobia: no  Obscessions/compulsions: no Depressed mood: yes Depression screen [facility] 2/9 12/01/2018 11/01/2018 01/02/2018 10/10/2017  Decreased Interest 1 2 0 0  Down, Depressed, Hopeless 0 0 0 0  PHQ - 2 Score 1 2 0 0  Altered sleeping 2 3 2  -  Tired, decreased energy 1 1 1  -  Change in appetite 1 1 1  -  Feeling bad or failure about yourself  0 1 0 -  Trouble concentrating 1 1 0 -  Moving slowly or fidgety/restless 0 0 0 -  Suicidal thoughts 0 0 0 -  PHQ-9 Score 6 9 4  -  Difficult doing work/chores - Somewhat difficult Not difficult at all -   Anhedonia: no Weight changes: no Insomnia: yes hard to fall  asleep  Hypersomnia: no Fatigue/loss of energy: yes Feelings of worthlessness: yes Feelings of guilt: yes Impaired concentration/indecisiveness: yes Suicidal ideations: no  Crying spells: yes Recent Stressors/Life Changes: yes   Relationship problems: no   Family stress: yes     Financial stress: yes    Job stress: yes    Recent death/loss: no   Relevant past medical, surgical, family and social history reviewed and updated as indicated. Interim medical history since our last visit reviewed. Allergies and medications reviewed and updated.  Review of Systems  Constitutional: Negative.   Respiratory: Negative.   Cardiovascular: Negative.   Gastrointestinal: Negative.   Musculoskeletal: Positive for back pain and myalgias. Negative for arthralgias, gait problem, joint swelling, neck pain and neck stiffness.  Skin: Negative.   Neurological: Negative.   Psychiatric/Behavioral: Positive for dysphoric mood and sleep disturbance. Negative for agitation, behavioral problems, confusion, decreased concentration, hallucinations, self-injury and suicidal ideas. The patient is nervous/anxious. The patient is not hyperactive.     Per HPI unless specifically indicated above     Objective:    There were no vitals taken for this visit.  Wt Readings from Last 3 Encounters:  12/01/18 185 lb (83.9 kg)  02/02/18 192 lb 9.6 oz (87.4 kg)  01/02/18 194 lb 14.4 oz (88.4 kg)    Physical Exam Vitals signs and nursing note reviewed.  Constitutional:      General: She is not in acute distress.    Appearance:  Normal appearance. She is not ill-appearing, toxic-appearing or diaphoretic.  HENT:     Head: Normocephalic and atraumatic.     Right Ear: External ear normal.     Left Ear: External ear normal.     Nose: Nose normal.     Mouth/Throat:     Mouth: Mucous membranes are moist.     Pharynx: Oropharynx is clear.  Eyes:     General: No scleral icterus.       Right eye: No discharge.         Left eye: No discharge.     Conjunctiva/sclera: Conjunctivae normal.     Pupils: Pupils are equal, round, and reactive to light.  Neck:     Musculoskeletal: Normal range of motion.  Pulmonary:     Effort: Pulmonary effort is normal. No respiratory distress.     Comments: Speaking in full sentences Musculoskeletal: Normal range of motion.  Skin:    Coloration: Skin is not jaundiced or pale.     Findings: No bruising, erythema, lesion or rash.  Neurological:     Mental Status: She is alert and oriented to person, place, and time. Mental status is at baseline.  Psychiatric:        Mood and Affect: Mood is anxious and depressed. Affect is tearful.        Behavior: Behavior normal.        Thought Content: Thought content normal.        Judgment: Judgment normal.     Results for orders placed or performed in visit on 11/23/18  Basic metabolic panel  Result Value Ref Range   Glucose 83 65 - 99 mg/dL   BUN 9 6 - 20 mg/dL   Creatinine, Ser 1.610.76 0.57 - 1.00 mg/dL   GFR calc non Af Amer 100 >59 mL/min/1.73   GFR calc Af Amer 115 >59 mL/min/1.73   BUN/Creatinine Ratio 12 9 - 23   Sodium 140 134 - 144 mmol/L   Potassium 4.7 3.5 - 5.2 mmol/L   Chloride 100 96 - 106 mmol/L   CO2 21 20 - 29 mmol/L   Calcium 9.6 8.7 - 10.2 mg/dL      Assessment & Plan:   Problem List Items Addressed This Visit      Other   Anxiety    Worse. Will treat with BID klonopin for the next 2 weeks and increase lexapro to 10mg . Recheck 2 weeks. Call with any concerns. Monitor closely.       Relevant Medications   escitalopram (LEXAPRO) 10 MG tablet    Other Visit Diagnoses    Acute left-sided low back pain with left-sided sciatica    -  Primary   Will treat with flexeril, naproxen and stretches. Recheck 2 weeks- if not getting better, will get her into PT. Call with any concerns.    Relevant Medications   naproxen (NAPROSYN) 500 MG tablet   cyclobenzaprine (FLEXERIL) 10 MG tablet       Follow up  plan: Return in about 2 weeks (around 01/26/2019) for Follow up mood and back.   . This visit was completed via FaceTime due to the restrictions of the COVID-19 pandemic. All issues as above were discussed and addressed. Physical exam was done as above through visual confirmation on FaceTime. If it was felt that the patient should be evaluated in the office, they were directed there. The patient verbally consented to this visit. . Location of the patient: home . Location of the provider: work .  Those involved with this call:  . Provider: Olevia PerchesMegan Johnson, DO . CMA: Wilhemena DurieBrittany Russell, CMA . Front Desk/Registration: Adela Portshristan Williamson  . Time spent on call: 25 minutes with patient face to face via video conference. More than 50% of this time was spent in counseling and coordination of care. 40 minutes total spent in review of patient's record and preparation of their chart.

## 2019-01-12 NOTE — Telephone Encounter (Signed)
Seen today. 

## 2019-01-12 NOTE — Telephone Encounter (Signed)
Patient called to advise that she just returned from [place] and is now experiencing sciatic back pain and anxiety. Patient is requesting a prescription for both issues or a phone call with some type of  homeopathic treatment suggestion. Patient states she has  lost some sensation in her left leg and is extremely uncomfortable. Patient states she cannot go to a an urgent care because she has an "autistic son" and her  he would not do well going with her to an appointment and does not have a Public librarian because her entire family is currently in [place].   [facility] in [place]

## 2019-01-12 NOTE — Assessment & Plan Note (Addendum)
Worse. Will treat with BID klonopin for the next 2 weeks and increase lexapro to 10mg . Recheck 2 weeks. Call with any concerns. Monitor closely.

## 2019-01-13 ENCOUNTER — Encounter: Payer: Self-pay | Admitting: Family Medicine

## 2019-02-02 ENCOUNTER — Other Ambulatory Visit: Payer: Self-pay

## 2019-02-02 ENCOUNTER — Encounter: Payer: Self-pay | Admitting: Family Medicine

## 2019-02-02 ENCOUNTER — Ambulatory Visit (INDEPENDENT_AMBULATORY_CARE_PROVIDER_SITE_OTHER): Admitting: Family Medicine

## 2019-02-02 DIAGNOSIS — F419 Anxiety disorder, unspecified: Secondary | ICD-10-CM

## 2019-02-02 DIAGNOSIS — M5442 Lumbago with sciatica, left side: Secondary | ICD-10-CM | POA: Diagnosis not present

## 2019-02-02 MED ORDER — HYDROXYZINE HCL 25 MG PO TABS: 25.0000 mg | ORAL_TABLET | Freq: Three times a day (TID) | ORAL | 2 refills | Status: AC | PRN

## 2019-02-02 MED ORDER — ESCITALOPRAM OXALATE 10 MG PO TABS: 10.0000 mg | ORAL_TABLET | Freq: Every day | ORAL | 2 refills | Status: AC

## 2019-02-02 MED ORDER — NAPROXEN 500 MG PO TABS: 500.0000 mg | ORAL_TABLET | Freq: Two times a day (BID) | ORAL | 3 refills | Status: AC

## 2019-02-02 MED ORDER — LISINOPRIL 10 MG PO TABS: 10.0000 mg | ORAL_TABLET | Freq: Every day | ORAL | 2 refills | Status: AC

## 2019-02-02 MED ORDER — CYCLOBENZAPRINE HCL 10 MG PO TABS: 10.0000 mg | ORAL_TABLET | Freq: Every day | ORAL | 1 refills | Status: AC

## 2019-02-02 NOTE — Progress Notes (Signed)
There were no vitals taken for this visit.   Subjective:    Patient ID: Cynthia Barnes, female    DOB: 1981-02-08, 38 y.o.   MRN: [id number]  HPI: Cynthia Barnes is a 38 y.o. female  Chief Complaint  Patient presents with  . Anxiety   ANXIETY/STRESS- planning to move to [place] to be with her husband. In the process of selling her house.  Duration:better Anxious mood: yes  Excessive worrying: yes Irritability: no  Sweating: no Nausea: no Palpitations:no Hyperventilation: no Panic attacks: yes Agoraphobia: no  Obscessions/compulsions: no Depressed mood: yes Depression screen [facility] 2/9 02/02/2019 12/01/2018 11/01/2018 01/02/2018 10/10/2017  Decreased Interest 0 1 2 0 0  Down, Depressed, Hopeless 0 0 0 0 0  PHQ - 2 Score 0 1 2 0 0  Altered sleeping 2 2 3 2  -  Tired, decreased energy 1 1 1 1  -  Change in appetite 1 1 1 1  -  Feeling bad or failure about yourself  0 0 1 0 -  Trouble concentrating 0 1 1 0 -  Moving slowly or fidgety/restless 0 0 0 0 -  Suicidal thoughts 0 0 0 0 -  PHQ-9 Score 4 6 9 4  -  Difficult doing work/chores Somewhat difficult - Somewhat difficult Not difficult at all -   GAD 7 : Generalized Anxiety Score 02/02/2019 12/01/2018 11/01/2018 02/02/2018  Nervous, Anxious, on Edge 3 2 2  0  Control/stop worrying 2 1 2  0  Worry too much - different things 2 1 2 1   Trouble relaxing 1 2 2 1   Restless 2 1 2 1   Easily annoyed or irritable 2 1 2  0  Afraid - awful might happen 0 0 2 0  Total GAD 7 Score 12 8 14 3   Anxiety Difficulty Somewhat difficult - Somewhat difficult Somewhat difficult   Anhedonia: no Weight changes: no Insomnia: no   Hypersomnia: no Fatigue/loss of energy: yes Feelings of worthlessness: no Feelings of guilt: no Impaired concentration/indecisiveness: no Suicidal ideations: no  Crying spells: no Recent Stressors/Life Changes: yes   Relationship problems: no   Family stress: yes     Financial stress: yes    Job stress: no    Recent  death/loss: no  Back is not doing any better- but has been going non-stop. Does not have time to do PT at this time.   Relevant past medical, surgical, family and social history reviewed and updated as indicated. Interim medical history since our last visit reviewed. Allergies and medications reviewed and updated.  Review of Systems  Constitutional: Negative.   Respiratory: Negative.   Cardiovascular: Negative.   Gastrointestinal: Negative.   Skin: Negative.   Neurological: Negative.   Psychiatric/Behavioral: Positive for dysphoric mood. Negative for agitation, behavioral problems, confusion, decreased concentration, hallucinations, self-injury, sleep disturbance and suicidal ideas. The patient is nervous/anxious. The patient is not hyperactive.     Per HPI unless specifically indicated above     Objective:    There were no vitals taken for this visit.  Wt Readings from Last 3 Encounters:  12/01/18 185 lb (83.9 kg)  02/02/18 192 lb 9.6 oz (87.4 kg)  01/02/18 194 lb 14.4 oz (88.4 kg)    Physical Exam Vitals signs and nursing note reviewed.  Constitutional:      General: She is not in acute distress.    Appearance: Normal appearance. She is not ill-appearing, toxic-appearing or diaphoretic.  HENT:     Head: Normocephalic and atraumatic.     Right Ear:  External ear normal.     Left Ear: External ear normal.     Nose: Nose normal.     Mouth/Throat:     Mouth: Mucous membranes are moist.     Pharynx: Oropharynx is clear.  Eyes:     General: No scleral icterus.       Right eye: No discharge.        Left eye: No discharge.     Conjunctiva/sclera: Conjunctivae normal.     Pupils: Pupils are equal, round, and reactive to light.  Neck:     Musculoskeletal: Normal range of motion.  Pulmonary:     Effort: Pulmonary effort is normal. No respiratory distress.     Comments: Speaking in full sentences Musculoskeletal: Normal range of motion.  Skin:    Coloration: Skin is not  jaundiced or pale.     Findings: No bruising, erythema, lesion or rash.  Neurological:     Mental Status: She is alert and oriented to person, place, and time. Mental status is at baseline.  Psychiatric:        Mood and Affect: Mood is anxious.        Behavior: Behavior normal.        Thought Content: Thought content normal.        Judgment: Judgment normal.     Results for orders placed or performed in visit on 11/23/18  Basic metabolic panel  Result Value Ref Range   Glucose 83 65 - 99 mg/dL   BUN 9 6 - 20 mg/dL   Creatinine, Ser 1.300.76 0.57 - 1.00 mg/dL   GFR calc non Af Amer 100 >59 mL/min/1.73   GFR calc Af Amer 115 >59 mL/min/1.73   BUN/Creatinine Ratio 12 9 - 23   Sodium 140 134 - 144 mmol/L   Potassium 4.7 3.5 - 5.2 mmol/L   Chloride 100 96 - 106 mmol/L   CO2 21 20 - 29 mmol/L   Calcium 9.6 8.7 - 10.2 mg/dL      Assessment & Plan:   Problem List Items Addressed This Visit      Other   Anxiety - Primary    Doing slightly better- only taking her klonopin a couple of times a week. Continue current regimen. Continue to monitor. Call with any concerns. Will get established with a new PCP up in [place].      Relevant Medications   hydrOXYzine (ATARAX/VISTARIL) 25 MG tablet   escitalopram (LEXAPRO) 10 MG tablet    Other Visit Diagnoses    Acute left-sided low back pain with left-sided sciatica       Will conitnue stretches. Call with any concerns.    Relevant Medications   cyclobenzaprine (FLEXERIL) 10 MG tablet   naproxen (NAPROSYN) 500 MG tablet       Follow up plan: Return Moving to [place]- will come in if need be.    . This visit was completed via FaceTime due to the restrictions of the COVID-19 pandemic. All issues as above were discussed and addressed. Physical exam was done as above through visual confirmation on FaceTime. If it was felt that the patient should be evaluated in the office, they were directed there. The patient verbally consented to this visit. .  Location of the patient: home . Location of the provider: work . Those involved with this call:  . Provider: Olevia PerchesMegan Johnson, DO . CMA: Wilhemena DurieBrittany Russell, CMA . Front Desk/Registration: Adela Portshristan Williamson  . Time spent on call: 15 minutes with patient face to  face via video conference. More than 50% of this time was spent in counseling and coordination of care. 23 minutes total spent in review of patient's record and preparation of their chart.

## 2019-02-02 NOTE — Assessment & Plan Note (Signed)
Doing slightly better- only taking her klonopin a couple of times a week. Continue current regimen. Continue to monitor. Call with any concerns. Will get established with a new PCP up in [place].

## 2019-03-30 ENCOUNTER — Telehealth: Payer: Self-pay | Admitting: Family Medicine

## 2019-03-30 NOTE — Telephone Encounter (Signed)
Pt.notified

## 2019-03-30 NOTE — Telephone Encounter (Signed)
Unfortunately, the one I was thinking of is in [place]- so way too far away. I can also recommend   [place] [address] [place] [address] [phone]

## 2019-03-30 NOTE — Telephone Encounter (Signed)
Copied from [place] [id number]. Topic: General - Inquiry >> Mar 29, 2019 10:18 AM Mathis Bud wrote: Reason for CRM: Patient called stating she would like to know the name of the family office in [place] that PCP had referred patient too. Call back  [phone] >> Mar 30, 2019  7:37 AM Georgina Peer, CMA wrote: Routing to provider. I do not see anything in the chart about this.

## 2019-11-08 IMAGING — DX DG LUMBAR SPINE COMPLETE 4+V
5 series · 5 of 5 positions shown · non-contrast
Comparison: None.

CLINICAL DATA: 36-year-old female felt pull in lower back bending
over 2 months ago. Pain in lower back and left leg since. Numbness
left leg. Initial encounter.

EXAM:
LUMBAR SPINE - COMPLETE 4+ VIEW

[l-spine ap]
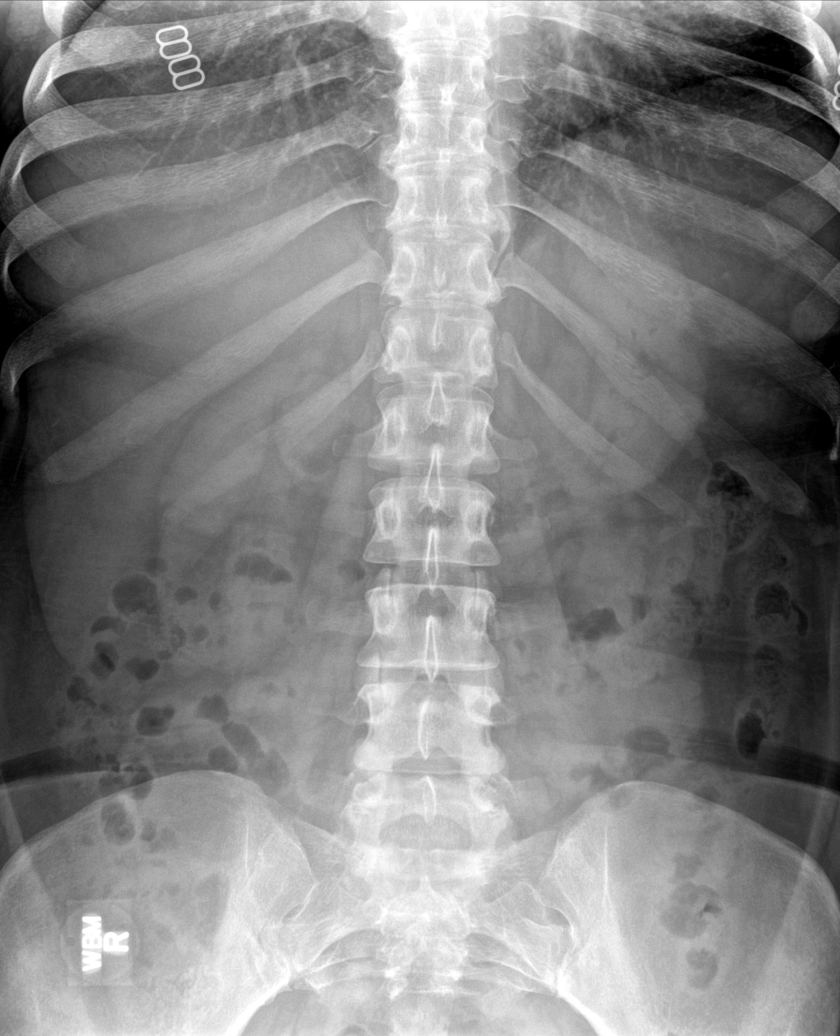

[l-spine obl (1 of 2)]
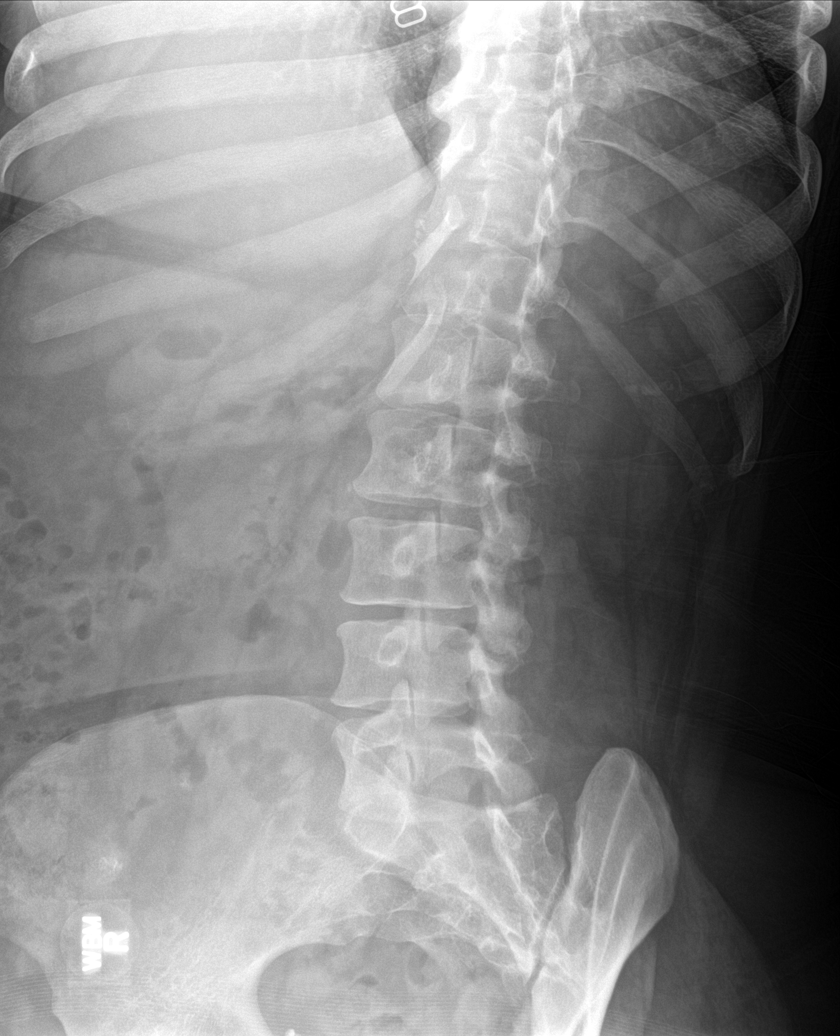

[l-spine obl (2 of 2)]
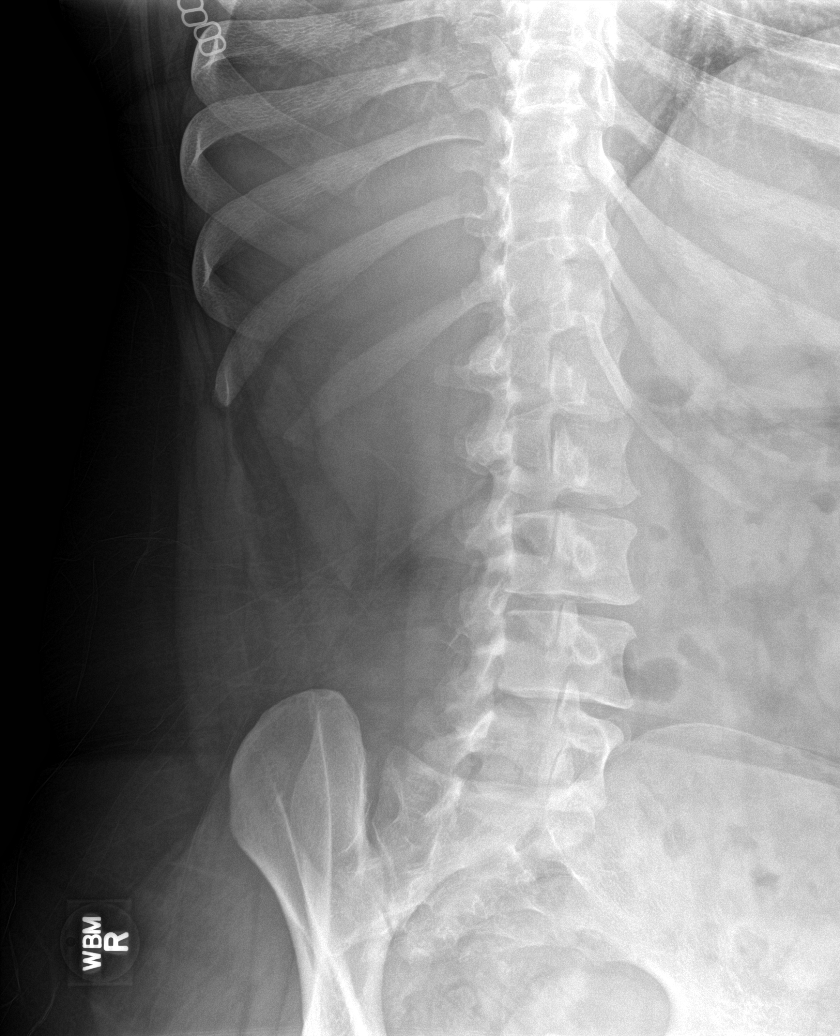

[l-spine lat]
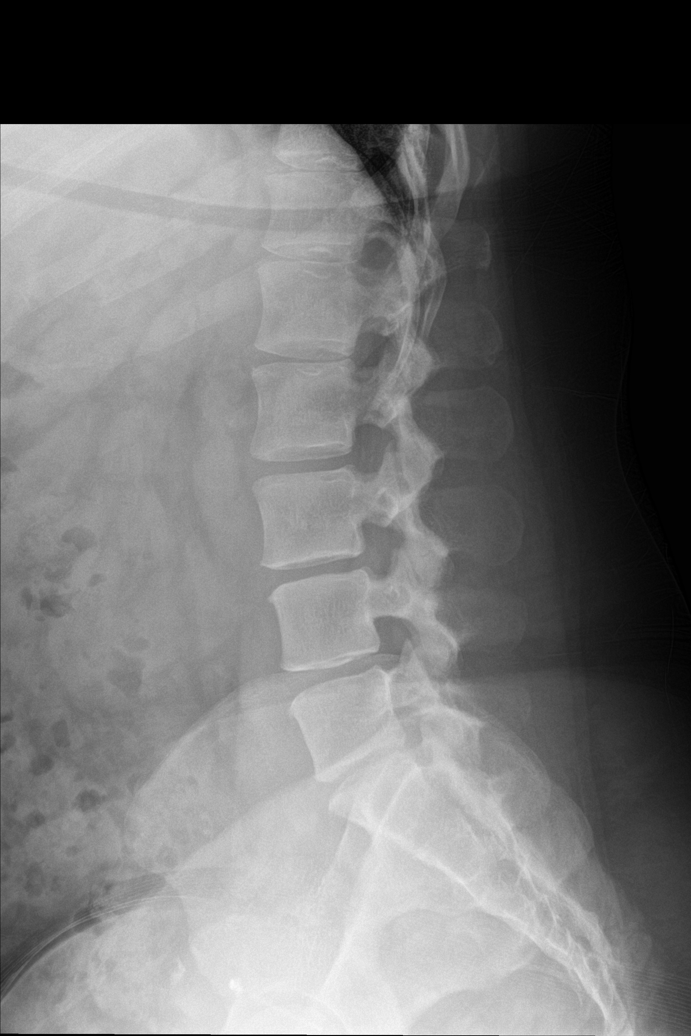

[l-spine spot]
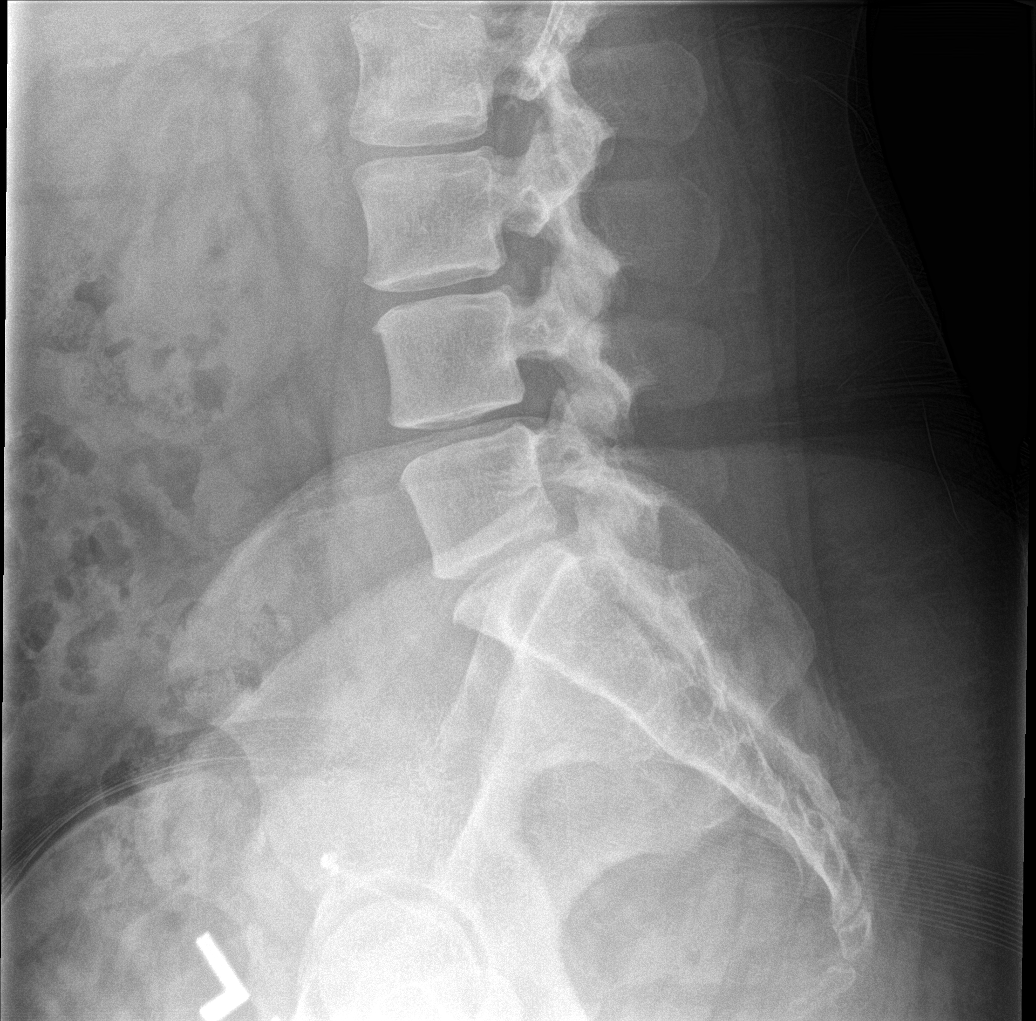

[5 of 5 positions shown; findings below may reference images not displayed]

FINDINGS: Slight straightening lumbar spine.

Mild L3-4, L4-5 and L5-S1 disc space narrowing.

No pars defect or fracture noted.
IMPRESSION: Mild L3-4, L4-5 and L5-S1 disc space narrowing.
